# Patient Record
Sex: Female | Born: 1935 | Hispanic: Yes | Marital: Married | Smoking: Never smoker
Health system: Southern US, Community
[De-identification: ages and names within clinical notes are randomized; demographics above are authoritative.]

## PROBLEM LIST (undated history)

## (undated) DIAGNOSIS — R7303 Prediabetes: Secondary | ICD-10-CM

## (undated) DIAGNOSIS — I10 Essential (primary) hypertension: Secondary | ICD-10-CM

## (undated) DIAGNOSIS — C259 Malignant neoplasm of pancreas, unspecified: Secondary | ICD-10-CM

## (undated) DIAGNOSIS — K8020 Calculus of gallbladder without cholecystitis without obstruction: Secondary | ICD-10-CM

## (undated) DIAGNOSIS — M810 Age-related osteoporosis without current pathological fracture: Secondary | ICD-10-CM

## (undated) DIAGNOSIS — E7800 Pure hypercholesterolemia, unspecified: Secondary | ICD-10-CM

## (undated) DIAGNOSIS — C801 Malignant (primary) neoplasm, unspecified: Secondary | ICD-10-CM

---

## 2013-03-12 ENCOUNTER — Inpatient Hospital Stay (HOSPITAL_COMMUNITY)
Admission: EM | Admit: 2013-03-12 | Discharge: 2013-03-18 | DRG: 418 | Disposition: A | Discharge status: Home / Self Care | Payer: 59 | Attending: Internal Medicine | Admitting: Internal Medicine

## 2013-03-12 ENCOUNTER — Emergency Department (HOSPITAL_COMMUNITY)
Admission: EM | Admit: 2013-03-12 | Discharge: 2013-03-12 | Disposition: A | Discharge status: Other Acute Inpatient Hospital | Payer: 59 | Source: Home / Self Care

## 2013-03-12 ENCOUNTER — Emergency Department (HOSPITAL_COMMUNITY): Payer: 59

## 2013-03-12 ENCOUNTER — Encounter (HOSPITAL_COMMUNITY): Payer: Self-pay | Admitting: Emergency Medicine

## 2013-03-12 DIAGNOSIS — E7800 Pure hypercholesterolemia, unspecified: Secondary | ICD-10-CM | POA: Diagnosis present

## 2013-03-12 DIAGNOSIS — K8020 Calculus of gallbladder without cholecystitis without obstruction: Secondary | ICD-10-CM

## 2013-03-12 DIAGNOSIS — B188 Other chronic viral hepatitis: Secondary | ICD-10-CM

## 2013-03-12 DIAGNOSIS — R7989 Other specified abnormal findings of blood chemistry: Secondary | ICD-10-CM | POA: Diagnosis present

## 2013-03-12 DIAGNOSIS — K8001 Calculus of gallbladder with acute cholecystitis with obstruction: Secondary | ICD-10-CM | POA: Diagnosis present

## 2013-03-12 DIAGNOSIS — K5900 Constipation, unspecified: Secondary | ICD-10-CM | POA: Diagnosis present

## 2013-03-12 DIAGNOSIS — I10 Essential (primary) hypertension: Secondary | ICD-10-CM | POA: Diagnosis present

## 2013-03-12 DIAGNOSIS — Q441 Other congenital malformations of gallbladder: Secondary | ICD-10-CM | POA: Diagnosis not present

## 2013-03-12 DIAGNOSIS — R1011 Right upper quadrant pain: Secondary | ICD-10-CM | POA: Diagnosis present

## 2013-03-12 DIAGNOSIS — E871 Hypo-osmolality and hyponatremia: Secondary | ICD-10-CM | POA: Diagnosis present

## 2013-03-12 DIAGNOSIS — R10811 Right upper quadrant abdominal tenderness: Secondary | ICD-10-CM

## 2013-03-12 DIAGNOSIS — Y92009 Unspecified place in unspecified non-institutional (private) residence as the place of occurrence of the external cause: Secondary | ICD-10-CM

## 2013-03-12 DIAGNOSIS — B172 Acute hepatitis E: Secondary | ICD-10-CM

## 2013-03-12 DIAGNOSIS — L299 Pruritus, unspecified: Secondary | ICD-10-CM | POA: Diagnosis present

## 2013-03-12 DIAGNOSIS — K8019 Calculus of gallbladder with other cholecystitis with obstruction: Secondary | ICD-10-CM | POA: Diagnosis present

## 2013-03-12 DIAGNOSIS — R17 Unspecified jaundice: Secondary | ICD-10-CM

## 2013-03-12 DIAGNOSIS — M542 Cervicalgia: Secondary | ICD-10-CM

## 2013-03-12 DIAGNOSIS — M810 Age-related osteoporosis without current pathological fracture: Secondary | ICD-10-CM | POA: Diagnosis present

## 2013-03-12 DIAGNOSIS — Z7982 Long term (current) use of aspirin: Secondary | ICD-10-CM

## 2013-03-12 DIAGNOSIS — K831 Obstruction of bile duct: Secondary | ICD-10-CM

## 2013-03-12 DIAGNOSIS — R5381 Other malaise: Secondary | ICD-10-CM

## 2013-03-12 DIAGNOSIS — IMO0002 Reserved for concepts with insufficient information to code with codable children: Secondary | ICD-10-CM

## 2013-03-12 DIAGNOSIS — Z79899 Other long term (current) drug therapy: Secondary | ICD-10-CM | POA: Diagnosis not present

## 2013-03-12 DIAGNOSIS — W06XXXA Fall from bed, initial encounter: Secondary | ICD-10-CM

## 2013-03-12 DIAGNOSIS — W19XXXA Unspecified fall, initial encounter: Secondary | ICD-10-CM

## 2013-03-12 DIAGNOSIS — Q4479 Other congenital malformations of liver: Secondary | ICD-10-CM

## 2013-03-12 HISTORY — DX: Calculus of gallbladder without cholecystitis without obstruction: K80.20

## 2013-03-12 HISTORY — DX: Age-related osteoporosis without current pathological fracture: M81.0

## 2013-03-12 HISTORY — DX: Essential (primary) hypertension: I10

## 2013-03-12 HISTORY — DX: Pure hypercholesterolemia, unspecified: E78.00

## 2013-03-12 HISTORY — DX: Prediabetes: R73.03

## 2013-03-12 LAB — CBC WITH DIFFERENTIAL/PLATELET
Basophils Absolute: 0 10*3/uL (ref 0.0–0.1)
Hemoglobin: 12.2 g/dL (ref 12.0–15.0)
Lymphocytes Relative: 30 % (ref 12–46)
Lymphs Abs: 1.6 10*3/uL (ref 0.7–4.0)
MCHC: 34.4 g/dL (ref 30.0–36.0)
Monocytes Relative: 12 % (ref 3–12)
Neutro Abs: 2.9 10*3/uL (ref 1.7–7.7)
Neutrophils Relative %: 55 % (ref 43–77)
Platelets: 323 10*3/uL (ref 150–400)
RBC: 4.48 MIL/uL (ref 3.87–5.11)
WBC: 5.2 10*3/uL (ref 4.0–10.5)

## 2013-03-12 LAB — COMPREHENSIVE METABOLIC PANEL
ALT: 508 U/L — ABNORMAL HIGH (ref 0–35)
AST: 449 U/L — ABNORMAL HIGH (ref 0–37)
Alkaline Phosphatase: 723 U/L — ABNORMAL HIGH (ref 39–117)
CO2: 25 mEq/L (ref 19–32)
Chloride: 98 mEq/L (ref 96–112)
GFR calc Af Amer: >90 mL/min (ref >90)
GFR calc non Af Amer: 85 mL/min — ABNORMAL LOW (ref >90)
Glucose, Bld: 113 mg/dL — ABNORMAL HIGH (ref 70–99)
Potassium: 4 mEq/L (ref 3.5–5.1)
Sodium: 134 mEq/L — ABNORMAL LOW (ref 135–145)
Total Bilirubin: 6.4 mg/dL — ABNORMAL HIGH (ref 0.3–1.2)
Total Protein: 7.9 g/dL (ref 6.0–8.3)

## 2013-03-12 LAB — URINALYSIS, ROUTINE W REFLEX MICROSCOPIC
Hgb urine dipstick: NEGATIVE
Ketones, ur: NEGATIVE mg/dL
Specific Gravity, Urine: 1.007 (ref 1.005–1.030)
Urobilinogen, UA: 1 mg/dL (ref 0.0–1.0)
pH: 5.5 (ref 5.0–8.0)

## 2013-03-12 NOTE — ED Provider Notes (Signed)
Medical screening examination/treatment/procedure(s) were performed by non-physician practitioner and as supervising physician I was immediately available for consultation/collaboration.  Leslee Home, M.D.  Reuben Likes, MD 03/12/13 2222

## 2013-03-12 NOTE — ED Notes (Signed)
Pt reports that her stools are lighter in color than normal.

## 2013-03-12 NOTE — ED Notes (Signed)
Pt is reporting cramping in her legs.

## 2013-03-12 NOTE — ED Notes (Signed)
Multiple c/o. Reported fall 2 days ago, struck posterior aspect of neck. C/o generalized weakness. States her eyes have turned yellow, stool has changed color, UA dark, burning sensation in feet and hands

## 2013-03-12 NOTE — ED Provider Notes (Signed)
CSN: [id number]     Arrival date & time 03/12/13  1416 History   First MD Initiated Contact with Patient 03/12/13 1544     Chief Complaint  Patient presents with  . Weakness   (Consider location/radiation/quality/duration/timing/severity/associated sxs/prior Treatment) HPI Comments: 79 year old female with a history of hepatitis E contracted proximally 7 years ago in [place] he is complaining of generalized itching, fatigue and malaise, yellowing of the skin and eyes, burning in the hands and feet and decreased appetite. This started just over 7 days ago. One week ago on Thanksgiving day the patient fell from the bed and struck the back of her head and neck on the windowsill. She is complaining of tenderness over the T1 spinous process. Her head feels heavy at the frontal and vertex aspect on occasion. There was no loss of consciousness and she denies any focal neurologic symptoms. Hx of cholelithiasis with small stones considered too small and non obstructing to consider surgical intervention.    Past Medical History  Diagnosis Date  . Hypertension   . High cholesterol   . Osteoporosis   . Prediabetes    History reviewed. No pertinent past surgical history. History reviewed. No pertinent family history. History  Substance Use Topics  . Smoking status: Never Smoker   . Smokeless tobacco: Not on file  . Alcohol Use: No   OB History   Grav Para Term Preterm Abortions TAB SAB Ect Mult Living                 Review of Systems  Constitutional: Positive for activity change, appetite change and fatigue. Negative for fever.  HENT: Negative.   Eyes:       Yellow eyes  Respiratory: Negative.   Cardiovascular: Negative.   Gastrointestinal: Positive for nausea. Negative for vomiting and abdominal pain.       Change in color of stools  Genitourinary: Negative for dysuria, frequency, flank pain and pelvic pain.       Change of color to urine  Neurological: Positive for light-headedness.  Negative for tremors, syncope, facial asymmetry and speech difficulty.    Allergies  Penicillins  Home Medications   Current Outpatient Rx  Name  Route  Sig  Dispense  Refill  . alendronate (FOSAMAX) 70 MG tablet   Oral   Take 70 mg by mouth once a week. Take with a full glass of water on an empty stomach.         Marland Kitchen aspirin 81 MG tablet   Oral   Take 81 mg by mouth daily.         . moexipril-hydrochlorothiazide (UNIRETIC) 7.5-12.5 MG per tablet   Oral   Take 1 tablet by mouth daily.         . rosuvastatin (CRESTOR) 5 MG tablet   Oral   Take 5 mg by mouth daily.          BP 129/49  Pulse 73  Temp(Src) 98 F (36.7 C) (Oral)  Resp 16  SpO2 100% Physical Exam  Nursing note and vitals reviewed. Constitutional: She is oriented to person, place, and time. She appears well-developed and well-nourished. No distress.  HENT:  Head: Normocephalic.  Mouth/Throat: Oropharynx is clear and moist. No oropharyngeal exudate.  Head nontender,   Eyes: EOM are normal. Scleral icterus is present.  Neck: Normal range of motion. Neck supple.  Tenderness over T1 spinous process only. No other neck pain or tenderness. No deformity.  Cardiovascular: Normal rate, regular rhythm and normal  heart sounds.   Pulmonary/Chest: Effort normal and breath sounds normal. No respiratory distress. She has no wheezes.  Abdominal: Soft. She exhibits no distension. There is tenderness. There is no rebound and no guarding.  Moderate tenderness over the RUQ and epigastrium.  Musculoskeletal: Normal range of motion. She exhibits no edema.  Lymphadenopathy:    She has no cervical adenopathy.  Neurological: She is alert and oriented to person, place, and time. She exhibits normal muscle tone.  Skin: Skin is warm and dry.  Skin jaundice   Psychiatric: She has a normal mood and affect.    ED Course  Procedures (including critical care time) Labs Review Labs Reviewed - No data to display Imaging  Review No results found.      MDM   1. Chronic hepatitis E   2. Jaundice   3. Malaise and fatigue   4. Abdominal tenderness, right upper quadrant   5. Pain in neck   6. Fall at home, initial encounter      Transfer to [facility] ED for hepatic disease associated with acute symptoms. Fall 7 d ago has heaviness in head and pain in neck over T1 spinous process.  Hayden Rasmussen, NP 03/12/13 [phone]

## 2013-03-12 NOTE — ED Notes (Addendum)
Pt sent here from [facility] for further work-up RE RUQ pain, yellow eyes, dark urine and burning sensation of feet and hands.  Hx of gall stones in 2011 and Hep E.  Tenderness on palpation only.

## 2013-03-12 NOTE — ED Notes (Signed)
Per ultrasound tech, transport is en route to take pt to ultrasound.

## 2013-03-12 NOTE — ED Provider Notes (Signed)
Pt presents to the hospital with increased jaundiced and RUQ pain - on exam has mild guarding to the RUQ but no peritoneal signs,  HDS with normal rate, rhythma, lung sounds adn no fever.  Significant transaminitis and bili - RUQ [place] c/w an impacted stone - GI consulted for ERCP and hospitalist for admission.  I saw and evaluated the patient, reviewed the resident's note and I agree with the findings and plan.   Vida Roller, MD 03/12/13 [phone]

## 2013-03-13 DIAGNOSIS — K838 Other specified diseases of biliary tract: Secondary | ICD-10-CM

## 2013-03-13 DIAGNOSIS — K831 Obstruction of bile duct: Secondary | ICD-10-CM | POA: Diagnosis present

## 2013-03-13 DIAGNOSIS — L299 Pruritus, unspecified: Secondary | ICD-10-CM | POA: Diagnosis present

## 2013-03-13 DIAGNOSIS — R7989 Other specified abnormal findings of blood chemistry: Secondary | ICD-10-CM | POA: Diagnosis present

## 2013-03-13 DIAGNOSIS — B172 Acute hepatitis E: Secondary | ICD-10-CM | POA: Diagnosis present

## 2013-03-13 LAB — COMPREHENSIVE METABOLIC PANEL
ALT: 446 U/L — ABNORMAL HIGH (ref 0–35)
AST: 402 U/L — ABNORMAL HIGH (ref 0–37)
Albumin: 3.1 g/dL — ABNORMAL LOW (ref 3.5–5.2)
CO2: 25 mEq/L (ref 19–32)
Calcium: 9 mg/dL (ref 8.4–10.5)
Creatinine, Ser: 0.51 mg/dL (ref 0.50–1.10)
GFR calc non Af Amer: 88 mL/min — ABNORMAL LOW (ref >90)
Potassium: 4.6 mEq/L (ref 3.5–5.1)
Sodium: 137 mEq/L (ref 135–145)
Total Bilirubin: 6.4 mg/dL — ABNORMAL HIGH (ref 0.3–1.2)
Total Protein: 7.2 g/dL (ref 6.0–8.3)

## 2013-03-13 LAB — HEPATIC FUNCTION PANEL
ALT: 456 U/L — ABNORMAL HIGH (ref 0–35)
AST: 397 U/L — ABNORMAL HIGH (ref 0–37)
Albumin: 3.1 g/dL — ABNORMAL LOW (ref 3.5–5.2)
Alkaline Phosphatase: 652 U/L — ABNORMAL HIGH (ref 39–117)
Bilirubin, Direct: 5 mg/dL — ABNORMAL HIGH (ref 0.0–0.3)
Total Bilirubin: 6.7 mg/dL — ABNORMAL HIGH (ref 0.3–1.2)
Total Protein: 7.1 g/dL (ref 6.0–8.3)

## 2013-03-13 LAB — CBC
HCT: 34.6 % — ABNORMAL LOW (ref 36.0–46.0)
Hemoglobin: 11.9 g/dL — ABNORMAL LOW (ref 12.0–15.0)
MCH: 27.3 pg (ref 26.0–34.0)
MCHC: 34.4 g/dL (ref 30.0–36.0)
MCV: 79.4 fL (ref 78.0–100.0)
RBC: 4.36 MIL/uL (ref 3.87–5.11)

## 2013-03-13 LAB — HEPATITIS PANEL, ACUTE
Hep A IgM: NONREACTIVE
Hep B C IgM: NONREACTIVE

## 2013-03-13 LAB — PROTIME-INR: Prothrombin Time: 12.9 seconds (ref 11.6–15.2)

## 2013-03-13 LAB — LIPASE, BLOOD: Lipase: 51 U/L (ref 11–59)

## 2013-03-13 MED ORDER — ONDANSETRON HCL 4 MG PO TABS: 4.0000 mg | ORAL_TABLET | Freq: Four times a day (QID) | ORAL | Status: DC | PRN

## 2013-03-13 MED ORDER — ENOXAPARIN SODIUM 30 MG/0.3ML ~~LOC~~ SOLN
30.0000 mg | SUBCUTANEOUS | Status: DC
  Administered 2013-03-13: 30 mg via SUBCUTANEOUS
  Filled 2013-03-13: qty 0.3

## 2013-03-13 MED ORDER — CIPROFLOXACIN IN D5W 400 MG/200ML IV SOLN
400.0000 mg | Freq: Two times a day (BID) | INTRAVENOUS | Status: DC
  Administered 2013-03-13 – 2013-03-17 (×10): 400 mg via INTRAVENOUS
  Filled 2013-03-13 (×11): qty 200

## 2013-03-13 MED ORDER — CIPROFLOXACIN IN D5W 400 MG/200ML IV SOLN
400.0000 mg | Freq: Once | INTRAVENOUS | Status: AC
  Administered 2013-03-13: 400 mg via INTRAVENOUS
  Filled 2013-03-13: qty 200

## 2013-03-13 MED ORDER — OXYCODONE HCL 5 MG PO TABS
5.0000 mg | ORAL_TABLET | ORAL | Status: DC | PRN
  Filled 2013-03-13: qty 1

## 2013-03-13 MED ORDER — PANTOPRAZOLE SODIUM 40 MG IV SOLR
40.0000 mg | INTRAVENOUS | Status: DC
  Administered 2013-03-13 – 2013-03-18 (×5): 40 mg via INTRAVENOUS
  Filled 2013-03-13 (×6): qty 40

## 2013-03-13 MED ORDER — ONDANSETRON HCL 4 MG/2ML IJ SOLN
4.0000 mg | Freq: Four times a day (QID) | INTRAMUSCULAR | Status: DC | PRN
  Administered 2013-03-14 – 2013-03-17 (×4): 4 mg via INTRAVENOUS
  Filled 2013-03-13 (×5): qty 2

## 2013-03-13 MED ORDER — SODIUM CHLORIDE 0.9 % IV SOLN
INTRAVENOUS | Status: DC
  Administered 2013-03-13 – 2013-03-15 (×3): via INTRAVENOUS
  Administered 2013-03-17: 20 mL/h via INTRAVENOUS

## 2013-03-13 NOTE — ED Notes (Signed)
Per Post, MD pt given water.

## 2013-03-13 NOTE — Progress Notes (Signed)
[facility]                                                                                Patient Demographics  Vicki Chavez, is a 79 y.o. female, DOB - Sep 19, 1929, ZOX:[id number]  Admit date - 03/12/2013   Admitting Physician Lynden Oxford, MD  Outpatient Primary MD for the patient is Pcp Not In System  LOS - 1   Chief Complaint  Patient presents with  . Abdominal Pain        Assessment & Plan   Principal Problem:   Obstructive jaundice Active Problems:   Biliary obstruction   h/o Hepatitis E   Elevated LFTs   Itching  Acute abdominal pain secondary to possible choledocholithiasis -Patient has obstructive jaundice -Gastroenterology consulted -Pending medical records from [facility] -Will continue to monitor her LFTs -Possible ERCP -Continue ciprofloxacin and Protonix  History of hepatitis E -Currently stable we'll obtain medical records  Elevated LFTs and biliary obstruction -Likely secondary to above we'll continue to monitor  Hyponatremia -Na 137, improving.   -Continue IVF  Code Status: Full  Family Communication: None at this time.  Disposition Plan: Admitted.  Expected LOS 2-3 midnights.   Procedures  None  Consults   Gastroenterology, Dr. Juanda Chance  DVT Prophylaxis Heparin  Lab Results  Component Value Date   PLT 292 03/13/2013    Medications  Scheduled Meds: . ciprofloxacin  400 mg Intravenous Q12H  . enoxaparin (LOVENOX) injection  30 mg Subcutaneous Q24H  . pantoprazole (PROTONIX) IV  40 mg Intravenous Q24H   Continuous Infusions: . sodium chloride 50 mL/hr at 03/13/13 0656   PRN Meds:.ondansetron (ZOFRAN) IV, ondansetron, oxyCODONE  Antibiotics    Anti-infectives   Start     Dose/Rate Route Frequency Ordered Stop   03/13/13 1200  ciprofloxacin (CIPRO) IVPB 400 mg     400 mg 200 mL/hr over 60 Minutes Intravenous Every 12 hours 03/13/13 0646     03/13/13 0015  ciprofloxacin (CIPRO) IVPB 400 mg     400 mg 200  mL/hr over 60 Minutes Intravenous  Once 03/13/13 0000 03/13/13 0143       Time Spent in minutes   30 minutes   Ashle Stief D.O. on 03/13/2013 at 11:53 AM  Between 7am to 7pm - Pager - [phone]  After 7pm go to [email or web address] - password [email or web address]  And look for the night coverage person covering for me after hours  [facility] Office  [phone]    Subjective:   Vicki Chavez seen and examined today.  Patient states her abdominal pain has improved. Patient denies dizziness, chest pain, shortness of breath, new weakness, numbess, tingling.    Objective:   Filed Vitals:   03/12/13 2121 03/13/13 0010 03/13/13 0108 03/13/13 0549  BP: 152/64 176/76 153/70 113/55  Pulse: 70 76 75 80  Temp:   97.5 F (36.4 C) 98.3 F (36.8 C)  TempSrc:   Oral Oral  Resp:   16 16  Height:      Weight:      SpO2: 100% 99% 100% 100%    Wt Readings from Last 3 Encounters:  03/12/13 50.984 kg (112 lb 6.4 oz)  Intake/Output Summary (Last 24 hours) at 03/13/13 1153 Last data filed at 03/13/13 0418  Gross per 24 hour  Intake      0 ml  Output    450 ml  Net   -450 ml    Exam  General: Well developed, well nourished, NAD, appears stated age  HEENT: NCAT, PERRLA, EOMI,  icteric sclera,  mucous membranes moist. No pharyngeal erythema or exudates  Neck: Supple, no JVD, no masses  Cardiovascular: S1 S2 auscultated, no rubs, murmurs or gallops. Regular rate and rhythm.  Respiratory: Clear to auscultation bilaterally with equal chest rise  Abdomen: Soft, mild tenderness to palpation of the right upper quadrant, nondistended, + bowel sounds  Extremities: warm dry without cyanosis clubbing or edema  Neuro: AAOx3, cranial nerves grossly intact. Strength 5/5 in patient's upper and lower extremities bilaterally  Skin: Without rashes exudates or nodules  Psych: Normal affect and demeanor with intact judgement and insight  Data Review   Micro Results No results found  for this or any previous visit (from the past 240 hour(s)).  Radiology Reports US Abdomen Complete  03/12/2013   CLINICAL DATA:  Abdominal pain.  EXAM: ULTRASOUND ABDOMEN COMPLETE  COMPARISON:  None.  FINDINGS: Gallbladder:  Multiple echogenic structures within the gallbladder are compatible with stones. Mild distention of the gallbladder. No significant gallbladder wall thickening. Reportedly, the patient does not have a sonographic Murphy's sign. Largest gallstone measures 1.2 cm. There are echogenic structures along the gallbladder wall that are suggestive for polyps and the largest measures 0.7 cm.  Common bile duct:  Diameter: Common bile duct is markedly dilated, measuring up to 1.5 cm.  Liver:  No focal lesion identified. Within normal limits in parenchymal echogenicity.  IVC:  No abnormality visualized.  Pancreas:  Visualized portion unremarkable.  Spleen:  Poorly visualized.  Right Kidney:  Length: 9.7 cm. Round hypoechoic structure in the right kidney lower pole measures 2.7 x 1.8 x 1.9 cm. The may be a septation within this structure and findings are suggestive for a cyst.  Left Kidney:  Length: 10.0 cm. There is mild fullness in left renal collecting system.  Abdominal aorta:  No aneurysm visualized.  Other findings:  None.  IMPRESSION: The gallbladder contains multiple echogenic stones. In addition, there appears to be gallbladder polyps.  Marked dilatation of the common bile duct, measuring up to 1.5 cm. Findings raise concern for a distal common bile duct obstruction.  Mild fullness or hydronephrosis in the left kidney of unknown etiology.  The common bile duct and left kidney findings could be further evaluated with CT.  Right renal cyst.  This cyst may be mildly complex as described.   Electronically Signed   By: Richarda Overlie M.D.   On: 03/12/2013 23:35    CBC  Recent Labs Lab 03/12/13 1646 03/13/13 0750  WBC 5.2 5.8  HGB 12.2 11.9*  HCT 35.5* 34.6*  PLT 323 292  MCV 79.2 79.4  MCH  27.2 27.3  MCHC 34.4 34.4  RDW 16.3* 16.2*  LYMPHSABS 1.6  --   MONOABS 0.7  --   EOSABS 0.1  --   BASOSABS 0.0  --     Chemistries   Recent Labs Lab 03/12/13 1646 03/13/13 0750 03/13/13 0830  NA 134* 137  --   K 4.0 4.6  --   CL 98 101  --   CO2 25 25  --   GLUCOSE 113* 99  --   BUN 20 14  --  CREATININE 0.56 0.51  --   CALCIUM 9.4 9.0  --   AST 449* 402* 397*  ALT 508* 446* 456*  ALKPHOS 723* 631* 652*  BILITOT 6.4* 6.4* 6.7*   ------------------------------------------------------------------------------------------------------------------ estimated creatinine clearance is 39.1 ml/min (by C-G formula based on Cr of 0.51). ------------------------------------------------------------------------------------------------------------------ No results found for this basename: HGBA1C,  in the last 72 hours ------------------------------------------------------------------------------------------------------------------ No results found for this basename: CHOL, HDL, LDLCALC, TRIG, CHOLHDL, LDLDIRECT,  in the last 72 hours ------------------------------------------------------------------------------------------------------------------ No results found for this basename: TSH, T4TOTAL, FREET3, T3FREE, THYROIDAB,  in the last 72 hours ------------------------------------------------------------------------------------------------------------------ No results found for this basename: VITAMINB12, FOLATE, FERRITIN, TIBC, IRON, RETICCTPCT,  in the last 72 hours  Coagulation profile  Recent Labs Lab 03/13/13 0750  INR 0.99    No results found for this basename: DDIMER,  in the last 72 hours  Cardiac Enzymes No results found for this basename: CK, CKMB, TROPONINI, MYOGLOBIN,  in the last 168 hours ------------------------------------------------------------------------------------------------------------------ No components found with this basename: POCBNP,

## 2013-03-13 NOTE — Consult Note (Signed)
   Consultation   History of Present Illness:  This is a 79 yo [place] female with  1 week of sickness, pruritus, dark urine and recent weight loss ( which she attributes to her husband sickness).Presented to ED last night with RUQ abd. Pain. She has a hx of Hepatitis E diagnosed at [facility] in [place] in 6 2006- she underwent liver biopsy. She says that the hepatitis is gone, no further problems. Her daughter is bringing the records ( Mrs Thedore Mins [phone]). This morning pt is feeling fine, denies abd. pain, fever, diarrhea. Her  PCP is in [facility] where she has lived for 10 years- Dr Wilhelmenia [phone].She was told to have gall stones in 2006, but she has never had an attack.. She was, however, jaundiced when she had Hep E in 2006   Past Medical History  Diagnosis Date  . Hypertension   . High cholesterol   . Osteoporosis   . Prediabetes   . Gall stones    History reviewed. No pertinent past surgical history.  reports that she has never smoked. She does not have any smokeless tobacco history on file. She reports that she does not drink alcohol or use illicit drugs. family history is not on file. Allergies  Allergen Reactions  . Penicillins     Does not remember        Review of Systems:dry mouth, pruritus, weight loss  The remainder of the 10 point ROS is negative except as outlined in H&P   Physical Exam: General appearance  slim, in no distress. Eyes- scleras icteric. HEENT nontraumatic, normocephalic. Mouth no lesions, tongue papillated, no cheilosis. Neck supple without adenopathy, thyroid not enlarged, no carotid bruits, no JVD. Lungs Clear to auscultation bilaterally. Cor normal S1, normal S2, regular rapid  rhythm, no murmur,  quiet precordium. Abdomen: soft, relaxed, soft bowl sounds, mild tenderness in RUQ, liver egde at CM, no ascites, splenic tip not pappable Rectal: brown heme negative stool Extremities no pedal edema. Skin no  lesions. Neurological alert and oriented x 3., no asterixis Psychological normal mood and affect.  Assessment and Plan:  79 yo [place] female with acute abd.pain consistent with biliary colic, pain resolved this am- ?? Stone has passed?, She has obstructive jaundice in the setting of dilated CBD which raises a question of chronic choledocholithiasis. There is no discrete stone noted on last night's ultrasound. She is not toxic appearing- no evidence of cholangitis.   Question of chronic liver disease needs to be clarified as to activity and severity- her daughter is bringing records from [facility], depending on the review of the records we will decide about timing of the ERCP. We are awaiting Ptime, and repeat LFT's from this am. Please continue antibiotics. And NPO status, for possible ERCP today  Or tomorrow depending how quickly we can gather the information. I think a malignant biliary stricture is a consideration but rather unlikely given the acute pain episode.   03/13/2013 Lina Sar

## 2013-03-13 NOTE — ED Provider Notes (Signed)
I saw and evaluated the patient, reviewed the resident's note and I agree with the findings and plan.  Please see my separate note regarding my evaluation of the patient.     Vida Roller, MD 03/13/13 [phone]

## 2013-03-13 NOTE — ED Provider Notes (Signed)
CSN: [id number]     Arrival date & time 03/12/13  1615 History   First MD Initiated Contact with Patient 03/12/13 2021     Chief Complaint  Patient presents with  . Abdominal Pain   (Consider location/radiation/quality/duration/timing/severity/associated sxs/prior Treatment) HPI  Chief complaint: Abdominal pain  Patient is 79 year old female past medical history significant for hepatitis C approximately 10 years ago comes in today with chief complaint of generalized fatigue, malaise and right upper quadrant pain. Patient states approximately one week she's been feeling fatigued more than usual. Also describing a vague discomfort in her abdomen. Is been present for one week has been worsening slowly. Has been constant worse with eating. Location of pain is right upper quadrant without radiation. No treatments tried so far. Patient denies any nausea, vomiting or fevers. Patient went to urgent care discovered she had significant right upper quadrant pain also noted to have scleral icterus dark urine. Given history of hepatitis C and gallstones patient was transferred to ED for further evaluation.  Past Medical History  Diagnosis Date  . Hypertension   . High cholesterol   . Osteoporosis   . Prediabetes   . Gall stones    History reviewed. No pertinent past surgical history. No family history on file. History  Substance Use Topics  . Smoking status: Never Smoker   . Smokeless tobacco: Not on file  . Alcohol Use: No   OB History   Grav Para Term Preterm Abortions TAB SAB Ect Mult Living                 Review of Systems  Constitutional: Positive for fatigue. Negative for fever.  Respiratory: Negative for shortness of breath.   Cardiovascular: Negative for chest pain.  Gastrointestinal: Positive for abdominal pain. Negative for nausea and vomiting.  Genitourinary: Negative for dysuria.  Musculoskeletal: Negative for back pain.  Skin: Negative for rash.  Neurological: Negative for  headaches.  Psychiatric/Behavioral: Negative for agitation.  All other systems reviewed and are negative.    Allergies  Penicillins  Home Medications   Current Outpatient Rx  Name  Route  Sig  Dispense  Refill  . alendronate (FOSAMAX) 70 MG tablet   Oral   Take 70 mg by mouth once a week. wednesdays         . aspirin 81 MG tablet   Oral   Take 81 mg by mouth daily.         Marland Kitchen b complex vitamins tablet   Oral   Take 1 tablet by mouth daily.         . calcium-vitamin D (OSCAL WITH D) 500-200 MG-UNIT per tablet   Oral   Take 1 tablet by mouth 2 (two) times daily.         . Ferrous Sulfate (IRON) 325 (65 FE) MG TABS   Oral   Take 1 tablet by mouth once a week. fridays         . moexipril-hydrochlorothiazide (UNIRETIC) 7.5-12.5 MG per tablet   Oral   Take 1 tablet by mouth daily.         . rosuvastatin (CRESTOR) 5 MG tablet   Oral   Take 5 mg by mouth at bedtime.           BP 176/76  Pulse 76  Temp(Src) 97.2 F (36.2 C) (Oral)  Resp 16  Ht 4\' 10"  (1.473 m)  Wt 112 lb 6.4 oz (50.984 kg)  BMI 23.50 kg/m2  SpO2 99% Physical Exam  Nursing note and vitals reviewed. Constitutional: She is oriented to person, place, and time. She appears well-developed and well-nourished.  HENT:  Head: Normocephalic and atraumatic.  Eyes: EOM are normal. Pupils are equal, round, and reactive to light. Scleral icterus is present.  Neck: Normal range of motion.  Cardiovascular: Normal rate, regular rhythm and intact distal pulses.   No murmur heard. Pulmonary/Chest: Effort normal and breath sounds normal. No respiratory distress.  Abdominal: Soft. She exhibits no distension. There is tenderness (right upper quadrant pain). There is no rebound and no guarding.  Positive Murphy sign  Musculoskeletal: Normal range of motion. She exhibits no edema.  Neurological: She is alert and oriented to person, place, and time. No cranial nerve deficit. She exhibits normal muscle tone.  Coordination normal.  Skin: Skin is warm and dry.  Psychiatric: She has a normal mood and affect. Her behavior is normal. Judgment and thought content normal.    ED Course  Procedures (including critical care time) Labs Review Labs Reviewed  CBC WITH DIFFERENTIAL - Abnormal; Notable for the following:    HCT 35.5 (*)    RDW 16.3 (*)    All other components within normal limits  COMPREHENSIVE METABOLIC PANEL - Abnormal; Notable for the following:    Sodium 134 (*)    Glucose, Bld 113 (*)    Albumin 3.4 (*)    AST 449 (*)    ALT 508 (*)    Alkaline Phosphatase 723 (*)    Total Bilirubin 6.4 (*)    GFR calc non Af Amer 85 (*)    All other components within normal limits  LIPASE, BLOOD - Abnormal; Notable for the following:    Lipase 86 (*)    All other components within normal limits  URINALYSIS, ROUTINE W REFLEX MICROSCOPIC - Abnormal; Notable for the following:    Bilirubin Urine SMALL (*)    All other components within normal limits   Imaging Review US Abdomen Complete  03/12/2013   CLINICAL DATA:  Abdominal pain.  EXAM: ULTRASOUND ABDOMEN COMPLETE  COMPARISON:  None.  FINDINGS: Gallbladder:  Multiple echogenic structures within the gallbladder are compatible with stones. Mild distention of the gallbladder. No significant gallbladder wall thickening. Reportedly, the patient does not have a sonographic Murphy's sign. Largest gallstone measures 1.2 cm. There are echogenic structures along the gallbladder wall that are suggestive for polyps and the largest measures 0.7 cm.  Common bile duct:  Diameter: Common bile duct is markedly dilated, measuring up to 1.5 cm.  Liver:  No focal lesion identified. Within normal limits in parenchymal echogenicity.  IVC:  No abnormality visualized.  Pancreas:  Visualized portion unremarkable.  Spleen:  Poorly visualized.  Right Kidney:  Length: 9.7 cm. Round hypoechoic structure in the right kidney lower pole measures 2.7 x 1.8 x 1.9 cm. The may be a  septation within this structure and findings are suggestive for a cyst.  Left Kidney:  Length: 10.0 cm. There is mild fullness in left renal collecting system.  Abdominal aorta:  No aneurysm visualized.  Other findings:  None.  IMPRESSION: The gallbladder contains multiple echogenic stones. In addition, there appears to be gallbladder polyps.  Marked dilatation of the common bile duct, measuring up to 1.5 cm. Findings raise concern for a distal common bile duct obstruction.  Mild fullness or hydronephrosis in the left kidney of unknown etiology.  The common bile duct and left kidney findings could be further evaluated with CT.  Right renal cyst.  This cyst  may be mildly complex as described.   Electronically Signed   By: Richarda Overlie M.D.   On: 03/12/2013 23:35    EKG Interpretation   None       MDM   1. Biliary obstruction     On arrival afebrile vital signs within normal limits. Patient with RUQ pain, scleral icterus, complains of dark urine, history of hepatitis E. and gallstones. Concern for gallbladder pathology including cholecystitis, choledocholithiasis or acute hepatitis. Right upper quadrant ultrasound and labs obtained. Ultrasound showed dilation of common bile duct with likely distal common bile duct obstruction labs significant for transaminitis, elevated lipase, elevated bilirubin. CBC showed no leukocytosis. Ultrasound showed no pericholecystic stranding or other findings consistent with cholecystitis. No need for emergent removal of gallbladder. Secondary to obstruction biliary tract GI was consult and will plan for MRCP and morning. Admitted to medicine. Patient was given one dose of Cipro in ED her GI recommendation. No other acute issues. Patient remained stable in ED until time of transfer to floor.   Bridgett Larsson, MD 03/13/13 [phone]

## 2013-03-13 NOTE — H&P (Signed)
[facility] History and Physical  Patient: Vicki Chavez  OZH:[id number]  DOB: 02-Jul-1929  DOS: the patient was seen and examined on 03/13/2013 PCP: Pcp Not In System  Chief Complaint: Abdominal pain  HPI: Vicki Chavez is a 79 y.o. female with Past medical history of hypertension, prediabetes, goes to, hepatitis E. The patient is coming from home. The patient presented with the complaints of right upper quadrant pain that has been ongoing since last one week. This was associated with fatigue and increased itchiness. She started noticing yellow discoloration of her urine as well and therefore she came to the hospital today. She denies any complaint of fever or chills. She mentions that she has some discomfort in her abdomen which gets worse with feeding. She denies any diarrhea or black color bowel movements or constipation. She mentions she has history of hepatitis B which he encountered in [place] and she also had a history of GI bleeding for which she went to [place] to fix it. At present she denies any complaint of chest pain, shortness of breath, palpitation, dizziness, lightheadedness, nausea, vomiting, burning urination, but diarrhea or constipation. She denies any similar symptoms in the past. She mentions 3 years ago she has been diagnosed with gallstone but did not have any symptoms therefore did not have any further workup.  Review of Systems: as mentioned in the history of present illness.  A Comprehensive review of the other systems is negative.  Past Medical History  Diagnosis Date  . Hypertension   . High cholesterol   . Osteoporosis   . Prediabetes   . Gall stones    History reviewed. No pertinent past surgical history. Social History:  reports that she has never smoked. She does not have any smokeless tobacco history on file. She reports that she does not drink alcohol or use illicit drugs. Independent for most of her  ADL.  Allergies  Allergen Reactions  .  Penicillins     Does not remember    No family history on file.  Prior to Admission medications   Medication Sig Start Date End Date Taking? Authorizing Provider  alendronate (FOSAMAX) 70 MG tablet Take 70 mg by mouth once a week. wednesdays   Yes Historical Provider, MD  aspirin 81 MG tablet Take 81 mg by mouth daily.   Yes Historical Provider, MD  b complex vitamins tablet Take 1 tablet by mouth daily.   Yes Historical Provider, MD  calcium-vitamin D (OSCAL WITH D) 500-200 MG-UNIT per tablet Take 1 tablet by mouth 2 (two) times daily.   Yes Historical Provider, MD  Ferrous Sulfate (IRON) 325 (65 FE) MG TABS Take 1 tablet by mouth once a week. fridays   Yes Historical Provider, MD  moexipril-hydrochlorothiazide (UNIRETIC) 7.5-12.5 MG per tablet Take 1 tablet by mouth daily.   Yes Historical Provider, MD  rosuvastatin (CRESTOR) 5 MG tablet Take 5 mg by mouth at bedtime.    Yes Historical Provider, MD    Physical Exam: Filed Vitals:   03/12/13 1903 03/12/13 2121 03/13/13 0010 03/13/13 0108  BP: 146/60 152/64 176/76 153/70  Pulse: 82 70 76 75  Temp: 97.2 F (36.2 C)   97.5 F (36.4 C)  TempSrc: Oral   Oral  Resp: 16   16  Height:      Weight:      SpO2: 100% 100% 99% 100%    General: Alert, Awake and Oriented to Time, Place and Person. Appear in mild distress Eyes: PERRL yellow discoloration ENT: Oral  Mucosa clear dry. Yellow discoloration Neck: No JVD Cardiovascular: S1 and S2 Present, no Murmur, Peripheral Pulses Present Respiratory: Bilateral Air entry equal and Decreased, Clear to Auscultation,  No Crackles,  Abdomen: Bowel Sound Present, Soft and negative Murphy, minimally tender right upper quadrant without any guarding or rigidity Skin: No Rash but yellow discoloration of the skin Extremities: No Pedal edema, no calf tenderness Neurologic: Grossly Unremarkable.  Labs on Admission:  CBC:  Recent Labs Lab 03/12/13 1646  WBC 5.2  NEUTROABS 2.9  HGB 12.2  HCT  35.5*  MCV 79.2  PLT 323    CMP     Component Value Date/Time   NA 134* 03/12/2013 1646   K 4.0 03/12/2013 1646   CL 98 03/12/2013 1646   CO2 25 03/12/2013 1646   GLUCOSE 113* 03/12/2013 1646   BUN 20 03/12/2013 1646   CREATININE 0.56 03/12/2013 1646   CALCIUM 9.4 03/12/2013 1646   PROT 7.9 03/12/2013 1646   ALBUMIN 3.4* 03/12/2013 1646   AST 449* 03/12/2013 1646   ALT 508* 03/12/2013 1646   ALKPHOS 723* 03/12/2013 1646   BILITOT 6.4* 03/12/2013 1646   GFRNONAA 85* 03/12/2013 1646   GFRAA >90 03/12/2013 1646     Recent Labs Lab 03/12/13 1646  LIPASE 86*   No results found for this basename: AMMONIA,  in the last 168 hours  No results found for this basename: CKTOTAL, CKMB, CKMBINDEX, TROPONINI,  in the last 168 hours BNP (last 3 results) No results found for this basename: PROBNP,  in the last 8760 hours  Radiological Exams on Admission: US Abdomen Complete  03/12/2013   CLINICAL DATA:  Abdominal pain.  EXAM: ULTRASOUND ABDOMEN COMPLETE  COMPARISON:  None.  FINDINGS: Gallbladder:  Multiple echogenic structures within the gallbladder are compatible with stones. Mild distention of the gallbladder. No significant gallbladder wall thickening. Reportedly, the patient does not have a sonographic Murphy's sign. Largest gallstone measures 1.2 cm. There are echogenic structures along the gallbladder wall that are suggestive for polyps and the largest measures 0.7 cm.  Common bile duct:  Diameter: Common bile duct is markedly dilated, measuring up to 1.5 cm.  Liver:  No focal lesion identified. Within normal limits in parenchymal echogenicity.  IVC:  No abnormality visualized.  Pancreas:  Visualized portion unremarkable.  Spleen:  Poorly visualized.  Right Kidney:  Length: 9.7 cm. Round hypoechoic structure in the right kidney lower pole measures 2.7 x 1.8 x 1.9 cm. The may be a septation within this structure and findings are suggestive for a cyst.  Left Kidney:  Length: 10.0 cm. There is mild  fullness in left renal collecting system.  Abdominal aorta:  No aneurysm visualized.  Other findings:  None.  IMPRESSION: The gallbladder contains multiple echogenic stones. In addition, there appears to be gallbladder polyps.  Marked dilatation of the common bile duct, measuring up to 1.5 cm. Findings raise concern for a distal common bile duct obstruction.  Mild fullness or hydronephrosis in the left kidney of unknown etiology.  The common bile duct and left kidney findings could be further evaluated with CT.  Right renal cyst.  This cyst may be mildly complex as described.   Electronically Signed   By: Richarda Overlie M.D.   On: 03/12/2013 23:35    EKG: Independently reviewed. normal EKG, normal sinus rhythm, unchanged from previous tracings.  Assessment/Plan Principal Problem:   Obstructive jaundice Active Problems:   Biliary obstruction   h/o Hepatitis E   Elevated LFTs  Itching   1. Obstructive jaundice The patient is presenting with complaints of right upper quadrant pain with yellow discoloration of the skin. She has undergone an ultrasound in the ER which shows that she has significantly dilated common bile duct as well as multiple gallstones. He has discussed the case with GI. I was initially told her that GI has decided to pursue ERCP but then the ED documentation does mention MRCP. At present I would keep the patient n.p.o. an attempt to discuss the case with GI. I will give the patient IV fluids I would continue her on IV Cipro I would also give her IV Zofran as needed and IV Protonix. I would hold her antihypertensive at present he  2. Itching Likely secondary to elevated bilirubin Should improve with improvement in LFTs   Consults: Gastroenterology  DVT Prophylaxis: subcutaneous Heparin  Code Status: Full  Family Communication: Family was present at bedside, opportunity was given to ask question and all questions were answered satisfactorily at the time of  interview. Disposition: Admitted to inpatient in med-surge unit.  Author: Lynden Oxford, MD [facility] Pager: [phone] 03/13/2013, 2:12 AM    If 7PM-7AM, please contact night-coverage [email or web address] Password [email or web address]

## 2013-03-13 NOTE — Progress Notes (Addendum)
I have reviewed records from 09/2004- acute Hep E- liver biopsy shows acute periportal inflammation, no granulomas, negative trichrome stain, c/w acute hepatitis. She had a abd. sono in 2011: 4.5 mm CBD , normal liver and spleen( 7 cm), gall stones ( wall 2 mm), She had normal LFT's in 11/2012 AST/ALT 22/23.  Imresssion: no significant chronic liver disease present, the current episode is c/w acute biliary colic. We will proceed with ERCP/sphicterotomy, clearance of the CBD by Dr Madilyn Fireman, I have spoken to Dr Georgeann Oppenheim, scheduled for 03/14/2013 11.00 am, with moderate sedation. I have discussed with the family. Will obtain surgical consult- pt wishes to proceed with lap chole while in the hospital. Continue antibiotics. INR normal  Hold Lovenox prior to ERCP

## 2013-03-13 NOTE — Progress Notes (Signed)
79yo female c/o RUQ pain x1wk, noted w/ yellow discoloration of skin, US reveals significantly dilated common bile duct as well as multiple gallstones, to begin IV ABX for cholecystitis.  Will begin Cipro 400 mg IV Q12H and monitor CBC, Cx, clinical progression.  Vernard Gambles, PharmD, BCPS 03/13/2013 6:49 AM

## 2013-03-14 ENCOUNTER — Inpatient Hospital Stay (HOSPITAL_COMMUNITY): Payer: 59

## 2013-03-14 ENCOUNTER — Encounter (HOSPITAL_COMMUNITY): Payer: Self-pay

## 2013-03-14 ENCOUNTER — Encounter (HOSPITAL_COMMUNITY)
Admission: EM | Disposition: A | Discharge status: Home / Self Care | Payer: Self-pay | Source: Home / Self Care | Attending: Internal Medicine

## 2013-03-14 DIAGNOSIS — K8001 Calculus of gallbladder with acute cholecystitis with obstruction: Secondary | ICD-10-CM | POA: Diagnosis not present

## 2013-03-14 DIAGNOSIS — R1011 Right upper quadrant pain: Secondary | ICD-10-CM | POA: Diagnosis not present

## 2013-03-14 DIAGNOSIS — K8020 Calculus of gallbladder without cholecystitis without obstruction: Secondary | ICD-10-CM

## 2013-03-14 HISTORY — PX: ERCP: SHX5425

## 2013-03-14 LAB — COMPREHENSIVE METABOLIC PANEL
ALT: 430 U/L — ABNORMAL HIGH (ref 0–35)
AST: 364 U/L — ABNORMAL HIGH (ref 0–37)
Albumin: 2.9 g/dL — ABNORMAL LOW (ref 3.5–5.2)
CO2: 23 mEq/L (ref 19–32)
Calcium: 8.9 mg/dL (ref 8.4–10.5)
Chloride: 102 mEq/L (ref 96–112)
Creatinine, Ser: 0.55 mg/dL (ref 0.50–1.10)
GFR calc non Af Amer: 86 mL/min — ABNORMAL LOW (ref >90)
Sodium: 137 mEq/L (ref 135–145)
Total Bilirubin: 5.7 mg/dL — ABNORMAL HIGH (ref 0.3–1.2)

## 2013-03-14 LAB — GLUCOSE, CAPILLARY: Glucose-Capillary: 111 mg/dL — ABNORMAL HIGH (ref 70–99)

## 2013-03-14 SURGERY — ERCP, WITH INTERVENTION IF INDICATED
Anesthesia: Moderate Sedation

## 2013-03-14 MED ORDER — MIDAZOLAM HCL 5 MG/ML IJ SOLN
INTRAMUSCULAR | Status: AC
  Filled 2013-03-14: qty 3

## 2013-03-14 MED ORDER — SODIUM CHLORIDE 0.9 % IV SOLN: INTRAVENOUS | Status: DC

## 2013-03-14 MED ORDER — HYDRALAZINE HCL 20 MG/ML IJ SOLN
10.0000 mg | Freq: Four times a day (QID) | INTRAMUSCULAR | Status: DC | PRN
  Administered 2013-03-14: 10 mg via INTRAVENOUS
  Filled 2013-03-14: qty 1

## 2013-03-14 MED ORDER — FENTANYL CITRATE 0.05 MG/ML IJ SOLN
INTRAMUSCULAR | Status: AC
  Filled 2013-03-14: qty 4

## 2013-03-14 MED ORDER — MIDAZOLAM HCL 10 MG/2ML IJ SOLN
INTRAMUSCULAR | Status: DC | PRN
  Administered 2013-03-14 (×2): 2 mg via INTRAVENOUS
  Administered 2013-03-14: 1 mg via INTRAVENOUS

## 2013-03-14 MED ORDER — BUTAMBEN-TETRACAINE-BENZOCAINE 2-2-14 % EX AERO
INHALATION_SPRAY | CUTANEOUS | Status: DC | PRN
  Administered 2013-03-14: 2 via TOPICAL

## 2013-03-14 MED ORDER — CHLORHEXIDINE GLUCONATE 4 % EX LIQD
1.0000 "application " | Freq: Once | CUTANEOUS | Status: AC
  Administered 2013-03-14: 1 via TOPICAL
  Filled 2013-03-14: qty 15

## 2013-03-14 MED ORDER — GLUCAGON HCL (RDNA) 1 MG IJ SOLR
INTRAMUSCULAR | Status: AC
  Filled 2013-03-14: qty 3

## 2013-03-14 MED ORDER — CIPROFLOXACIN IN D5W 400 MG/200ML IV SOLN
400.0000 mg | INTRAVENOUS | Status: DC
  Filled 2013-03-14: qty 200

## 2013-03-14 MED ORDER — SODIUM CHLORIDE 0.9 % IV SOLN
INTRAVENOUS | Status: DC | PRN
  Administered 2013-03-14: 12:00:00

## 2013-03-14 MED ORDER — FENTANYL CITRATE 0.05 MG/ML IJ SOLN
INTRAMUSCULAR | Status: DC | PRN
  Administered 2013-03-14 (×3): 25 ug via INTRAVENOUS

## 2013-03-14 NOTE — Progress Notes (Signed)
ERCP done. There appeared to be a large distal stone with diffuse biliary tree dilatation. A large sphincterotomy was performed and balloon drag through the sphincterotomy. We did not see a stone delivered but on multiple subsequent cholangiograms there appeared to be no further stones. Some of the later cholangiograms were opacified with air bubbles but was felt to there is no residual stone. Okay for laparoscopic cholecystectomy from GI standpoint. Will check liver function tests tomorrow.

## 2013-03-14 NOTE — Op Note (Signed)
[facility] [address] [place], [address]   ERCP PROCEDURE REPORT  PATIENT: Vicki, Chavez  MR# :[id number] BIRTHDATE: 1929-12-18  GENDER: Female ENDOSCOPIST: Dorena Cookey, MD REFERRED BY: PROCEDURE DATE:  03/14/2013 PROCEDURE: ASA CLASS: INDICATIONS:gallstones with dilated bile duct suspected common bile duct stone MEDICATIONS: 75 mcg fentanyl 5 mg Versed TOPICAL ANESTHETIC: Cetacaine spray  DESCRIPTION OF PROCEDURE:   After the risks benefits and alternatives of the procedure were thoroughly explained, informed consent was obtained.  The Pentax side-viewing       endoscope was introduced through the mouth  and advanced to the papilla of Vater      .  it had a normal if somewhat enlarged appearance.      it was cannulated with the sphincterotome and a very large sphincterotomy made after cholangiogram showed a large distal common bile duct stone. A 15-18 mm balloon catheter was advanced into the duct and pulled through the sphincterotomy nearly fully inflated. We did not see a stone endoscopically but on repeat injury and opacification of the bile duct we can no longer see the stone. There is diffuse intra-and extrahepatic dilatation. The balloon catheter was advanced high up common hepatic duct and multiple balloon sweeps were made. No further stone was seen the liver and no further stones seen on the cholangiogram.  The scope was then completely withdrawn from the patient and the procedure terminated.     COMPLICATIONS: no immediate complication  ENDOSCOPIC IMPRESSION: large common bile duct stone on initial cholangiogram removed after sphincterotomy. RECOMMENDATIONS: OK to proceed with laparoscopic cholecystectomy.    _______________________________ Rosalie DoctorDorena Cookey, MD 03/14/2013 11:54 AM   CC:

## 2013-03-14 NOTE — Progress Notes (Signed)
[facility]                                                                                Patient Demographics  Vicki Chavez, is a 79 y.o. female, DOB - 01-10-1930, WUJ:[id number]  Admit date - 03/12/2013   Admitting Physician Lynden Oxford, MD  Outpatient Primary MD for the patient is Pcp Not In System  LOS - 2   Chief Complaint  Patient presents with  . Abdominal Pain        Assessment & Plan   Principal Problem:   Obstructive jaundice Active Problems:   Biliary obstruction   h/o Hepatitis E   Elevated LFTs   Itching  Acute abdominal pain secondary to possible choledocholithiasis -Patient has obstructive jaundice -Gastroenterology consulted -Will continue to monitor her LFTs, trending downward -ERCP plan for today -Continue ciprofloxacin and Protonix -Surgery consulted by GI for possible laparoscopic cholecystectomy  History of hepatitis E -Records from June of 2006 show acute hepatitis B with liver biopsy showing acute periportal inflammation, no granulomas, negative trichrome stain consistent with acute hepatitis -Gastroenterology following  Elevated LFTs and biliary obstruction -Likely secondary to above we'll continue to monitor  Hyponatremia -Na 137, improving.   -Continue IVF  Code Status: Full  Family Communication: None at this time.  Disposition Plan: Admitted.  Expected LOS 2-3 midnights.   Procedures  None  Consults   Gastroenterology, Dr. Juanda Chance  DVT Prophylaxis Heparin  Lab Results  Component Value Date   PLT 292 03/13/2013    Medications  Scheduled Meds: . ciprofloxacin  400 mg Intravenous Q12H  . pantoprazole (PROTONIX) IV  40 mg Intravenous Q24H   Continuous Infusions: . sodium chloride 50 mL/hr at 03/14/13 0819   PRN Meds:.ondansetron (ZOFRAN) IV, ondansetron, oxyCODONE  Antibiotics    Anti-infectives   Start     Dose/Rate Route Frequency Ordered Stop   03/13/13 1200  ciprofloxacin (CIPRO) IVPB 400 mg     400  mg 200 mL/hr over 60 Minutes Intravenous Every 12 hours 03/13/13 0646     03/13/13 0015  ciprofloxacin (CIPRO) IVPB 400 mg     400 mg 200 mL/hr over 60 Minutes Intravenous  Once 03/13/13 0000 03/13/13 0143       Time Spent in minutes   25 minutes   Freada Twersky D.O. on 03/14/2013 at 11:11 AM  Between 7am to 7pm - Pager - [phone]  After 7pm go to [email or web address] - password [email or web address]  And look for the night coverage person covering for me after hours  [facility] Office  [phone]    Subjective:   Vicki Chavez seen and examined today.  Patient states her abdominal pain has improved. Patient complains of nausea. Patient denies dizziness, chest pain, shortness of breath, new weakness, numbess, tingling.    Objective:   Filed Vitals:   03/13/13 0549 03/13/13 1330 03/13/13 2143 03/14/13 0627  BP: 113/55 149/62 131/58 121/46  Pulse: 80 71 89 87  Temp: 98.3 F (36.8 C) 98.1 F (36.7 C) 97.9 F (36.6 C) 98 F (36.7 C)  TempSrc: Oral Oral Oral   Resp: 16 16 16 17   Height:      Weight:  SpO2: 100% 98% 100% 100%    Wt Readings from Last 3 Encounters:  03/12/13 50.984 kg (112 lb 6.4 oz)  03/12/13 50.984 kg (112 lb 6.4 oz)    No intake or output data in the 24 hours ending 03/14/13 1111  Exam  General: Well developed, well nourished, NAD, appears stated age  HEENT: NCAT, PERRLA, EOMI,  icteric sclera,  mucous membranes moist. No pharyngeal erythema or exudates  Neck: Supple, no JVD, no masses  Cardiovascular: S1 S2 auscultated, no rubs, murmurs or gallops. Regular rate and rhythm.  Respiratory: Clear to auscultation bilaterally with equal chest rise  Abdomen: Soft, mild tenderness to palpation of the right upper quadrant, nondistended, + bowel sounds  Extremities: warm dry without cyanosis clubbing or edema  Neuro: AAOx3, cranial nerves grossly intact. Strength 5/5 in patient's upper and lower extremities bilaterally  Skin: Without rashes  exudates or nodules  Psych: Normal affect and demeanor with intact judgement and insight  Data Review   Micro Results No results found for this or any previous visit (from the past 240 hour(s)).  Radiology Reports US Abdomen Complete  03/12/2013   CLINICAL DATA:  Abdominal pain.  EXAM: ULTRASOUND ABDOMEN COMPLETE  COMPARISON:  None.  FINDINGS: Gallbladder:  Multiple echogenic structures within the gallbladder are compatible with stones. Mild distention of the gallbladder. No significant gallbladder wall thickening. Reportedly, the patient does not have a sonographic Murphy's sign. Largest gallstone measures 1.2 cm. There are echogenic structures along the gallbladder wall that are suggestive for polyps and the largest measures 0.7 cm.  Common bile duct:  Diameter: Common bile duct is markedly dilated, measuring up to 1.5 cm.  Liver:  No focal lesion identified. Within normal limits in parenchymal echogenicity.  IVC:  No abnormality visualized.  Pancreas:  Visualized portion unremarkable.  Spleen:  Poorly visualized.  Right Kidney:  Length: 9.7 cm. Round hypoechoic structure in the right kidney lower pole measures 2.7 x 1.8 x 1.9 cm. The may be a septation within this structure and findings are suggestive for a cyst.  Left Kidney:  Length: 10.0 cm. There is mild fullness in left renal collecting system.  Abdominal aorta:  No aneurysm visualized.  Other findings:  None.  IMPRESSION: The gallbladder contains multiple echogenic stones. In addition, there appears to be gallbladder polyps.  Marked dilatation of the common bile duct, measuring up to 1.5 cm. Findings raise concern for a distal common bile duct obstruction.  Mild fullness or hydronephrosis in the left kidney of unknown etiology.  The common bile duct and left kidney findings could be further evaluated with CT.  Right renal cyst.  This cyst may be mildly complex as described.   Electronically Signed   By: Richarda Overlie M.D.   On: 03/12/2013 23:35     CBC  Recent Labs Lab 03/12/13 1646 03/13/13 0750  WBC 5.2 5.8  HGB 12.2 11.9*  HCT 35.5* 34.6*  PLT 323 292  MCV 79.2 79.4  MCH 27.2 27.3  MCHC 34.4 34.4  RDW 16.3* 16.2*  LYMPHSABS 1.6  --   MONOABS 0.7  --   EOSABS 0.1  --   BASOSABS 0.0  --     Chemistries   Recent Labs Lab 03/12/13 1646 03/13/13 0750 03/13/13 0830 03/14/13 0540  NA 134* 137  --  137  K 4.0 4.6  --  4.2  CL 98 101  --  102  CO2 25 25  --  23  GLUCOSE 113* 99  --  115*  BUN 20 14  --  13  CREATININE 0.56 0.51  --  0.55  CALCIUM 9.4 9.0  --  8.9  AST 449* 402* 397* 364*  ALT 508* 446* 456* 430*  ALKPHOS 723* 631* 652* 638*  BILITOT 6.4* 6.4* 6.7* 5.7*   ------------------------------------------------------------------------------------------------------------------ estimated creatinine clearance is 39.1 ml/min (by C-G formula based on Cr of 0.55). ------------------------------------------------------------------------------------------------------------------ No results found for this basename: HGBA1C,  in the last 72 hours ------------------------------------------------------------------------------------------------------------------ No results found for this basename: CHOL, HDL, LDLCALC, TRIG, CHOLHDL, LDLDIRECT,  in the last 72 hours ------------------------------------------------------------------------------------------------------------------ No results found for this basename: TSH, T4TOTAL, FREET3, T3FREE, THYROIDAB,  in the last 72 hours ------------------------------------------------------------------------------------------------------------------ No results found for this basename: VITAMINB12, FOLATE, FERRITIN, TIBC, IRON, RETICCTPCT,  in the last 72 hours  Coagulation profile  Recent Labs Lab 03/13/13 0750  INR 0.99    No results found for this basename: DDIMER,  in the last 72 hours  Cardiac Enzymes No results found for this basename: CK, CKMB, TROPONINI,  MYOGLOBIN,  in the last 168 hours ------------------------------------------------------------------------------------------------------------------ No components found with this basename: POCBNP,

## 2013-03-14 NOTE — Consult Note (Signed)
Reason for Consult:gallstones/ CBD stones  Referring Physician: Dr Dorena Cookey MD   Vicki Chavez is an 79 y.o. female.  HPI: Asked to see pt at request of Dr Madilyn Fireman for CBD stone and cholelithiasis.  Admitted 12/5 for jaundice and epigastric discomfort.  Underwent ERCP today for CBD stone.  She is a liitle sore in upper abdomen. FAMILY AT BEDSIDE.  Denies severe abdominal pain  at this point.    Past Medical History  Diagnosis Date  . Hypertension   . High cholesterol   . Osteoporosis   . Prediabetes   . Gall stones     History reviewed. No pertinent past surgical history.  History reviewed. No pertinent family history.  Social History:  reports that she has never smoked. She does not have any smokeless tobacco history on file. She reports that she does not drink alcohol or use illicit drugs.  Allergies:  Allergies  Allergen Reactions  . Penicillins     Does not remember    Medications: I have reviewed the patient's current medications.  Results for orders placed during the hospital encounter of 03/12/13 (from the past 48 hour(s))  URINALYSIS, ROUTINE W REFLEX MICROSCOPIC     Status: Abnormal   Collection Time    03/12/13  4:43 PM      Result Value Range   Color, Urine YELLOW  YELLOW   APPearance CLEAR  CLEAR   Specific Gravity, Urine 1.007  1.005 - 1.030   pH 5.5  5.0 - 8.0   Glucose, UA NEGATIVE  NEGATIVE mg/dL   Hgb urine dipstick NEGATIVE  NEGATIVE   Bilirubin Urine SMALL (*) NEGATIVE   Ketones, ur NEGATIVE  NEGATIVE mg/dL   Protein, ur NEGATIVE  NEGATIVE mg/dL   Urobilinogen, UA 1.0  0.0 - 1.0 mg/dL   Nitrite NEGATIVE  NEGATIVE   Leukocytes, UA NEGATIVE  NEGATIVE   Comment: MICROSCOPIC NOT DONE ON URINES WITH NEGATIVE PROTEIN, BLOOD, LEUKOCYTES, NITRITE, OR GLUCOSE <1000 mg/dL.  CBC WITH DIFFERENTIAL     Status: Abnormal   Collection Time    03/12/13  4:46 PM      Result Value Range   WBC 5.2  4.0 - 10.5 K/uL   RBC 4.48  3.87 - 5.11 MIL/uL   Hemoglobin 12.2   12.0 - 15.0 g/dL   HCT 40.9 (*) 81.1 - 91.4 %   MCV 79.2  78.0 - 100.0 fL   MCH 27.2  26.0 - 34.0 pg   MCHC 34.4  30.0 - 36.0 g/dL   RDW 78.2 (*) 95.6 - 21.3 %   Platelets 323  150 - 400 K/uL   Neutrophils Relative % 55  43 - 77 %   Neutro Abs 2.9  1.7 - 7.7 K/uL   Lymphocytes Relative 30  12 - 46 %   Lymphs Abs 1.6  0.7 - 4.0 K/uL   Monocytes Relative 12  3 - 12 %   Monocytes Absolute 0.7  0.1 - 1.0 K/uL   Eosinophils Relative 2  0 - 5 %   Eosinophils Absolute 0.1  0.0 - 0.7 K/uL   Basophils Relative 1  0 - 1 %   Basophils Absolute 0.0  0.0 - 0.1 K/uL  COMPREHENSIVE METABOLIC PANEL     Status: Abnormal   Collection Time    03/12/13  4:46 PM      Result Value Range   Sodium 134 (*) 135 - 145 mEq/L   Potassium 4.0  3.5 - 5.1 mEq/L  Chloride 98  96 - 112 mEq/L   CO2 25  19 - 32 mEq/L   Glucose, Bld 113 (*) 70 - 99 mg/dL   BUN 20  6 - 23 mg/dL   Creatinine, Ser 1.61  0.50 - 1.10 mg/dL   Calcium 9.4  8.4 - 09.6 mg/dL   Total Protein 7.9  6.0 - 8.3 g/dL   Albumin 3.4 (*) 3.5 - 5.2 g/dL   AST 045 (*) 0 - 37 U/L   ALT 508 (*) 0 - 35 U/L   Alkaline Phosphatase 723 (*) 39 - 117 U/L   Total Bilirubin 6.4 (*) 0.3 - 1.2 mg/dL   GFR calc non Af Amer 85 (*) >90 mL/min   GFR calc Af Amer >90  >90 mL/min   Comment: (NOTE)     The eGFR has been calculated using the CKD EPI equation.     This calculation has not been validated in all clinical situations.     eGFR's persistently <90 mL/min signify possible Chronic Kidney     Disease.  LIPASE, BLOOD     Status: Abnormal   Collection Time    03/12/13  4:46 PM      Result Value Range   Lipase 86 (*) 11 - 59 U/L  COMPREHENSIVE METABOLIC PANEL     Status: Abnormal   Collection Time    03/13/13  7:50 AM      Result Value Range   Sodium 137  135 - 145 mEq/L   Potassium 4.6  3.5 - 5.1 mEq/L   Comment: HEMOLYSIS AT THIS LEVEL MAY AFFECT RESULT   Chloride 101  96 - 112 mEq/L   CO2 25  19 - 32 mEq/L   Glucose, Bld 99  70 - 99 mg/dL   BUN  14  6 - 23 mg/dL   Creatinine, Ser 4.09  0.50 - 1.10 mg/dL   Calcium 9.0  8.4 - 81.1 mg/dL   Total Protein 7.2  6.0 - 8.3 g/dL   Albumin 3.1 (*) 3.5 - 5.2 g/dL   AST 914 (*) 0 - 37 U/L   Comment: HEMOLYSIS AT THIS LEVEL MAY AFFECT RESULT   ALT 446 (*) 0 - 35 U/L   Comment: HEMOLYSIS AT THIS LEVEL MAY AFFECT RESULT   Alkaline Phosphatase 631 (*) 39 - 117 U/L   Total Bilirubin 6.4 (*) 0.3 - 1.2 mg/dL   GFR calc non Af Amer 88 (*) >90 mL/min   GFR calc Af Amer >90  >90 mL/min   Comment: (NOTE)     The eGFR has been calculated using the CKD EPI equation.     This calculation has not been validated in all clinical situations.     eGFR's persistently <90 mL/min signify possible Chronic Kidney     Disease.  CBC     Status: Abnormal   Collection Time    03/13/13  7:50 AM      Result Value Range   WBC 5.8  4.0 - 10.5 K/uL   RBC 4.36  3.87 - 5.11 MIL/uL   Hemoglobin 11.9 (*) 12.0 - 15.0 g/dL   HCT 78.2 (*) 95.6 - 21.3 %   MCV 79.4  78.0 - 100.0 fL   MCH 27.3  26.0 - 34.0 pg   MCHC 34.4  30.0 - 36.0 g/dL   RDW 08.6 (*) 57.8 - 46.9 %   Platelets 292  150 - 400 K/uL  PROTIME-INR     Status: None   Collection Time  03/13/13  7:50 AM      Result Value Range   Prothrombin Time 12.9  11.6 - 15.2 seconds   INR 0.99  0.00 - 1.49  HEPATIC FUNCTION PANEL     Status: Abnormal   Collection Time    03/13/13  8:30 AM      Result Value Range   Total Protein 7.1  6.0 - 8.3 g/dL   Albumin 3.1 (*) 3.5 - 5.2 g/dL   AST 161 (*) 0 - 37 U/L   ALT 456 (*) 0 - 35 U/L   Alkaline Phosphatase 652 (*) 39 - 117 U/L   Total Bilirubin 6.7 (*) 0.3 - 1.2 mg/dL   Bilirubin, Direct 5.0 (*) 0.0 - 0.3 mg/dL   Indirect Bilirubin 1.7 (*) 0.3 - 0.9 mg/dL  AMYLASE     Status: None   Collection Time    03/13/13  8:30 AM      Result Value Range   Amylase 101  0 - 105 U/L  LIPASE, BLOOD     Status: None   Collection Time    03/13/13  8:30 AM      Result Value Range   Lipase 51  11 - 59 U/L  HEPATITIS PANEL,  ACUTE     Status: None   Collection Time    03/13/13  8:30 AM      Result Value Range   Hepatitis B Surface Ag NEGATIVE  NEGATIVE   HCV Ab NEGATIVE  NEGATIVE   Hep A IgM NON REACTIVE  NON REACTIVE   Hep B C IgM NON REACTIVE  NON REACTIVE   Comment: (NOTE)     High levels of Hepatitis B Core IgM antibody are detectable     during the acute stage of Hepatitis B. This antibody is used     to differentiate current from past HBV infection.     Performed at [facility]  COMPREHENSIVE METABOLIC PANEL     Status: Abnormal   Collection Time    03/14/13  5:40 AM      Result Value Range   Sodium 137  135 - 145 mEq/L   Potassium 4.2  3.5 - 5.1 mEq/L   Chloride 102  96 - 112 mEq/L   CO2 23  19 - 32 mEq/L   Glucose, Bld 115 (*) 70 - 99 mg/dL   BUN 13  6 - 23 mg/dL   Creatinine, Ser 0.96  0.50 - 1.10 mg/dL   Calcium 8.9  8.4 - 04.5 mg/dL   Total Protein 6.7  6.0 - 8.3 g/dL   Albumin 2.9 (*) 3.5 - 5.2 g/dL   AST 409 (*) 0 - 37 U/L   ALT 430 (*) 0 - 35 U/L   Alkaline Phosphatase 638 (*) 39 - 117 U/L   Total Bilirubin 5.7 (*) 0.3 - 1.2 mg/dL   GFR calc non Af Amer 86 (*) >90 mL/min   GFR calc Af Amer >90  >90 mL/min   Comment: (NOTE)     The eGFR has been calculated using the CKD EPI equation.     This calculation has not been validated in all clinical situations.     eGFR's persistently <90 mL/min signify possible Chronic Kidney     Disease.    US Abdomen Complete  03/12/2013   CLINICAL DATA:  Abdominal pain.  EXAM: ULTRASOUND ABDOMEN COMPLETE  COMPARISON:  None.  FINDINGS: Gallbladder:  Multiple echogenic structures within the gallbladder are compatible with stones. Mild distention of  the gallbladder. No significant gallbladder wall thickening. Reportedly, the patient does not have a sonographic Murphy's sign. Largest gallstone measures 1.2 cm. There are echogenic structures along the gallbladder wall that are suggestive for polyps and the largest measures 0.7 cm.  Common bile duct:   Diameter: Common bile duct is markedly dilated, measuring up to 1.5 cm.  Liver:  No focal lesion identified. Within normal limits in parenchymal echogenicity.  IVC:  No abnormality visualized.  Pancreas:  Visualized portion unremarkable.  Spleen:  Poorly visualized.  Right Kidney:  Length: 9.7 cm. Round hypoechoic structure in the right kidney lower pole measures 2.7 x 1.8 x 1.9 cm. The may be a septation within this structure and findings are suggestive for a cyst.  Left Kidney:  Length: 10.0 cm. There is mild fullness in left renal collecting system.  Abdominal aorta:  No aneurysm visualized.  Other findings:  None.  IMPRESSION: The gallbladder contains multiple echogenic stones. In addition, there appears to be gallbladder polyps.  Marked dilatation of the common bile duct, measuring up to 1.5 cm. Findings raise concern for a distal common bile duct obstruction.  Mild fullness or hydronephrosis in the left kidney of unknown etiology.  The common bile duct and left kidney findings could be further evaluated with CT.  Right renal cyst.  This cyst may be mildly complex as described.   Electronically Signed   By: Richarda Overlie M.D.   On: 03/12/2013 23:35    Review of Systems  Constitutional: Negative.   Eyes: Negative.   Respiratory: Negative.   Cardiovascular: Negative.   Gastrointestinal: Positive for abdominal pain.  Genitourinary: Negative.   Musculoskeletal: Negative.   Skin: Positive for itching.  Neurological: Negative.   Endo/Heme/Allergies: Negative.   Psychiatric/Behavioral: Negative.    Blood pressure 156/59, pulse 74, temperature 98 F (36.7 C), temperature source Oral, resp. rate 18, height 4\' 10"  (1.473 m), weight 112 lb 6.4 oz (50.984 kg), SpO2 100.00%. Physical Exam  Constitutional: She is oriented to person, place, and time. She appears well-developed.  HENT:  Head: Normocephalic and atraumatic.  Eyes: Scleral icterus is present.  Cardiovascular: Normal rate.   Respiratory:  Effort normal.  GI: She exhibits no distension. There is tenderness.  Musculoskeletal: Normal range of motion.  Neurological: She is alert and oriented to person, place, and time.  Skin: Skin is warm and dry.  Psychiatric: She has a normal mood and affect. Her behavior is normal. Judgment and thought content normal.    Assessment/Plan: choledocolithiasis s/p ERCP   Gallstones  Recommend Lap chole IOC to pt and family during this admission.  Offered surgery today since she is NPO but family wished to wait until am since some concern for post op pancreatitis.   Will check labs in AM and post for Monday for DOW MD.    Kolson Chovanec A. 03/14/2013, 1:04 PM

## 2013-03-15 ENCOUNTER — Encounter (HOSPITAL_COMMUNITY): Payer: Self-pay | Admitting: Gastroenterology

## 2013-03-15 LAB — COMPREHENSIVE METABOLIC PANEL
ALT: 345 U/L — ABNORMAL HIGH (ref 0–35)
AST: 262 U/L — ABNORMAL HIGH (ref 0–37)
AST: 230 U/L — ABNORMAL HIGH (ref 0–37)
Albumin: 3 g/dL — ABNORMAL LOW (ref 3.5–5.2)
Alkaline Phosphatase: 568 U/L — ABNORMAL HIGH (ref 39–117)
Alkaline Phosphatase: 581 U/L — ABNORMAL HIGH (ref 39–117)
BUN: 10 mg/dL (ref 6–23)
BUN: 10 mg/dL (ref 6–23)
Calcium: 8.7 mg/dL (ref 8.4–10.5)
Chloride: 103 mEq/L (ref 96–112)
GFR calc Af Amer: >90 mL/min (ref >90)
Glucose, Bld: 104 mg/dL — ABNORMAL HIGH (ref 70–99)
Glucose, Bld: 116 mg/dL — ABNORMAL HIGH (ref 70–99)
Potassium: 4.1 mEq/L (ref 3.5–5.1)
Potassium: 4.3 mEq/L (ref 3.5–5.1)
Sodium: 135 mEq/L (ref 135–145)
Sodium: 136 mEq/L (ref 135–145)
Total Bilirubin: 6.2 mg/dL — ABNORMAL HIGH (ref 0.3–1.2)
Total Bilirubin: 4.4 mg/dL — ABNORMAL HIGH (ref 0.3–1.2)
Total Protein: 6.4 g/dL (ref 6.0–8.3)
Total Protein: 6.8 g/dL (ref 6.0–8.3)

## 2013-03-15 LAB — GLUCOSE, CAPILLARY

## 2013-03-15 LAB — SURGICAL PCR SCREEN
MRSA, PCR: NEGATIVE
Staphylococcus aureus: NEGATIVE

## 2013-03-15 MED ORDER — TRAMADOL HCL 50 MG PO TABS
25.0000 mg | ORAL_TABLET | Freq: Once | ORAL | Status: AC
  Administered 2013-03-15: 25 mg via ORAL
  Filled 2013-03-15 (×2): qty 1

## 2013-03-15 MED ORDER — TRAMADOL HCL 50 MG PO TABS
25.0000 mg | ORAL_TABLET | Freq: Four times a day (QID) | ORAL | Status: DC | PRN
  Filled 2013-03-15: qty 1

## 2013-03-15 MED ORDER — TRAMADOL HCL 50 MG PO TABS: 50.0000 mg | ORAL_TABLET | Freq: Once | ORAL | Status: DC

## 2013-03-15 NOTE — Progress Notes (Signed)
[facility]                                                                                Patient Demographics  Vicki Chavez, is a 79 y.o. female, DOB - [birth year], ZOX:[id number]  Admit date - 03/12/2013   Admitting Physician Lynden Oxford, MD  Outpatient Primary MD for the patient is Pcp Not In System  LOS - 3   Chief Complaint  Patient presents with  . Abdominal Pain        Assessment & Plan   Principal Problem:   Obstructive jaundice Active Problems:   Biliary obstruction   h/o Hepatitis E   Elevated LFTs   Itching  Acute abdominal pain secondary to possible choledocholithiasis -Patient has obstructive jaundice -Gastroenterology consulted -Will continue to monitor her LFTs, trending downward -ERCP 12/7.  Large distal stone with diffuse biliary tree dilatation -Continue ciprofloxacin and Protonix -Surgery consulted for lap chole (planned for today)  History of hepatitis E -Records from June of 2006 show acute hepatitis B with liver biopsy showing acute periportal inflammation, no granulomas, negative trichrome stain consistent with acute hepatitis -Gastroenterology following  Elevated LFTs and biliary obstruction -Trending downward  Hyponatremia -Na 136, improving.   -Continue IVF  Code Status: Full  Family Communication: Daughter at bedside  Disposition Plan: Admitted.    Procedures  ERCP with sphincteromtomy  Consults   Gastroenterology, Dr. Juanda Chance General surgery  DVT Prophylaxis Heparin  Lab Results  Component Value Date   PLT 292 03/13/2013    Medications  Scheduled Meds: . ciprofloxacin  400 mg Intravenous Q12H  . ciprofloxacin  400 mg Intravenous On Call to OR  . pantoprazole (PROTONIX) IV  40 mg Intravenous Q24H   Continuous Infusions: . sodium chloride 50 mL/hr at 03/14/13 0819  . sodium chloride     PRN Meds:.hydrALAZINE, ondansetron (ZOFRAN) IV, ondansetron, oxyCODONE  Antibiotics    Anti-infectives   Start      Dose/Rate Route Frequency Ordered Stop   03/15/13 0600  ciprofloxacin (CIPRO) IVPB 400 mg     400 mg 200 mL/hr over 60 Minutes Intravenous On call to O.R. 03/14/13 1319 03/16/13 0559   03/13/13 1200  ciprofloxacin (CIPRO) IVPB 400 mg     400 mg 200 mL/hr over 60 Minutes Intravenous Every 12 hours 03/13/13 0646     03/13/13 0015  ciprofloxacin (CIPRO) IVPB 400 mg     400 mg 200 mL/hr over 60 Minutes Intravenous  Once 03/13/13 0000 03/13/13 0143       Time Spent in minutes   25 minutes   Vicki Chavez D.O. on 03/15/2013 at 10:59 AM  Between 7am to 7pm - Pager - [phone]  After 7pm go to [email or web address] - password [email or web address]  And look for the night coverage person covering for me after hours  [facility] Office  [phone]    Subjective:   Vicki Chavez seen and examined today.  Patient states her abdominal pain has improved. She does have some complaints of numbness in her feet, but does not wish to take any medication. Her nausea has improved. Patient denies dizziness, chest pain, shortness of breath, new weakness,  tingling.    Objective:  Filed Vitals:   03/14/13 1545 03/14/13 1820 03/14/13 2119 03/15/13 0522  BP: 152/61 162/62 127/51 121/52  Pulse: 74 83 85 96  Temp: 97.4 F (36.3 C) 97.5 F (36.4 C) 97.9 F (36.6 C) 98 F (36.7 C)  TempSrc: Oral Oral Oral   Resp: 18 18 18 17   Height:      Weight:      SpO2: 100% 100% 100% 96%    Wt Readings from Last 3 Encounters:  03/12/13 50.984 kg (112 lb 6.4 oz)  03/12/13 50.984 kg (112 lb 6.4 oz)     Intake/Output Summary (Last 24 hours) at 03/15/13 1059 Last data filed at 03/15/13 0900  Gross per 24 hour  Intake 3017.67 ml  Output   1700 ml  Net 1317.67 ml    Exam  General: Well developed, well nourished, NAD, appears stated age  HEENT: NCAT, icteric sclera,  mucous membranes moist.   Neck: Supple, no JVD, no masses  Cardiovascular: S1 S2 auscultated, no rubs, murmurs or gallops. Regular  rate and rhythm.  Respiratory: Clear to auscultation bilaterally with equal chest rise  Abdomen: Soft, mild tenderness to palpation of the right upper quadrant, nondistended, + bowel sounds  Extremities: warm dry without cyanosis clubbing or edema  Neuro: AAOx3, cranial nerves grossly intact.   Skin: Without rashes exudates or nodules  Psych: Normal affect and demeanor with intact judgement and insight  Data Review   Micro Results Recent Results (from the past 240 hour(s))  SURGICAL PCR SCREEN     Status: None   Collection Time    03/15/13  5:44 AM      Result Value Range Status   MRSA, PCR NEGATIVE  NEGATIVE Final   Staphylococcus aureus NEGATIVE  NEGATIVE Final   Comment:            The Xpert SA Assay (FDA     approved for NASAL specimens     in patients over 63 years of age),     is one component of     a comprehensive surveillance     program.  Test performance has     been validated by [employer] for patients greater     than or equal to 13 year old.     It is not intended     to diagnose infection nor to     guide or monitor treatment.    Radiology Reports US Abdomen Complete  03/12/2013   CLINICAL DATA:  Abdominal pain.  EXAM: ULTRASOUND ABDOMEN COMPLETE  COMPARISON:  None.  FINDINGS: Gallbladder:  Multiple echogenic structures within the gallbladder are compatible with stones. Mild distention of the gallbladder. No significant gallbladder wall thickening. Reportedly, the patient does not have a sonographic Murphy's sign. Largest gallstone measures 1.2 cm. There are echogenic structures along the gallbladder wall that are suggestive for polyps and the largest measures 0.7 cm.  Common bile duct:  Diameter: Common bile duct is markedly dilated, measuring up to 1.5 cm.  Liver:  No focal lesion identified. Within normal limits in parenchymal echogenicity.  IVC:  No abnormality visualized.  Pancreas:  Visualized portion unremarkable.  Spleen:  Poorly visualized.  Right  Kidney:  Length: 9.7 cm. Round hypoechoic structure in the right kidney lower pole measures 2.7 x 1.8 x 1.9 cm. The may be a septation within this structure and findings are suggestive for a cyst.  Left Kidney:  Length: 10.0 cm. There is mild fullness in left renal collecting  system.  Abdominal aorta:  No aneurysm visualized.  Other findings:  None.  IMPRESSION: The gallbladder contains multiple echogenic stones. In addition, there appears to be gallbladder polyps.  Marked dilatation of the common bile duct, measuring up to 1.5 cm. Findings raise concern for a distal common bile duct obstruction.  Mild fullness or hydronephrosis in the left kidney of unknown etiology.  The common bile duct and left kidney findings could be further evaluated with CT.  Right renal cyst.  This cyst may be mildly complex as described.   Electronically Signed   By: Richarda Overlie M.D.   On: 03/12/2013 23:35    CBC  Recent Labs Lab 03/12/13 1646 03/13/13 0750  WBC 5.2 5.8  HGB 12.2 11.9*  HCT 35.5* 34.6*  PLT 323 292  MCV 79.2 79.4  MCH 27.2 27.3  MCHC 34.4 34.4  RDW 16.3* 16.2*  LYMPHSABS 1.6  --   MONOABS 0.7  --   EOSABS 0.1  --   BASOSABS 0.0  --     Chemistries   Recent Labs Lab 03/12/13 1646 03/13/13 0750 03/13/13 0830 03/14/13 0540 03/15/13 0045 03/15/13 0655  NA 134* 137  --  137 135 136  K 4.0 4.6  --  4.2 4.1 4.3  CL 98 101  --  102 103 103  CO2 25 25  --  23 22 26   GLUCOSE 113* 99  --  115* 104* 116*  BUN 20 14  --  13 10 10   CREATININE 0.56 0.51  --  0.55 0.50 0.65  CALCIUM 9.4 9.0  --  8.9 8.7 9.1  AST 449* 402* 397* 364* 262* 230*  ALT 508* 446* 456* 430* 364* 345*  ALKPHOS 723* 631* 652* 638* 568* 581*  BILITOT 6.4* 6.4* 6.7* 5.7* 6.2* 4.4*   ------------------------------------------------------------------------------------------------------------------ estimated creatinine clearance is 39.1 ml/min (by C-G formula based on Cr of  0.65). ------------------------------------------------------------------------------------------------------------------ No results found for this basename: HGBA1C,  in the last 72 hours ------------------------------------------------------------------------------------------------------------------ No results found for this basename: CHOL, HDL, LDLCALC, TRIG, CHOLHDL, LDLDIRECT,  in the last 72 hours ------------------------------------------------------------------------------------------------------------------ No results found for this basename: TSH, T4TOTAL, FREET3, T3FREE, THYROIDAB,  in the last 72 hours ------------------------------------------------------------------------------------------------------------------ No results found for this basename: VITAMINB12, FOLATE, FERRITIN, TIBC, IRON, RETICCTPCT,  in the last 72 hours  Coagulation profile  Recent Labs Lab 03/13/13 0750  INR 0.99    No results found for this basename: DDIMER,  in the last 72 hours  Cardiac Enzymes No results found for this basename: CK, CKMB, TROPONINI, MYOGLOBIN,  in the last 168 hours ------------------------------------------------------------------------------------------------------------------ No components found with this basename: POCBNP,

## 2013-03-15 NOTE — Progress Notes (Signed)
1 Day Post-Op  Subjective: C/o headache.  No abd pain, n/v.  Daughter at bedside.    Objective: Vital signs in last 24 hours: Temp:  [97.4 F (36.3 C)-98 F (36.7 C)] 98 F (36.7 C) (12/08 0522) Pulse Rate:  [74-96] 96 (12/08 0522) Resp:  [0-18] 17 (12/08 0522) BP: (121-246)/(51-160) 121/52 mmHg (12/08 0522) SpO2:  [96 %-100 %] 96 % (12/08 0522) Last BM Date: 03/14/13  Intake/Output from previous day: 12/07 0701 - 12/08 0700 In: 2677.7 [P.O.:1240; I.V.:837.7; IV Piggyback:600] Out: 1350 [Urine:1350] Intake/Output this shift: Total I/O In: 340 [P.O.:340] Out: 350 [Urine:350]  PE General appearance: alert, cooperative, appears stated age and no distress GI: soft, non-tender; bowel sounds normal; no masses,  no organomegaly  Lab Results:   Recent Labs  03/12/13 1646 03/13/13 0750  WBC 5.2 5.8  HGB 12.2 11.9*  HCT 35.5* 34.6*  PLT 323 292   BMET  Recent Labs  03/15/13 0045 03/15/13 0655  NA 135 136  K 4.1 4.3  CL 103 103  CO2 22 26  GLUCOSE 104* 116*  BUN 10 10  CREATININE 0.50 0.65  CALCIUM 8.7 9.1   PT/INR  Recent Labs  03/13/13 0750  LABPROT 12.9  INR 0.99   ABG No results found for this basename: PHART, PCO2, PO2, HCO3,  in the last 72 hours  Studies/Results: Dg Ercp Biliary & Pancreatic Ducts  03/14/2013   CLINICAL DATA:  Biliary obstruction with obstructive jaundice.  EXAM: ERCP with sphincterotomy  TECHNIQUE: Multiple spot images obtained with the fluoroscopic device and submitted for interpretation post-procedure.  COMPARISON:  Abdomen ultrasound dated 03/12/2013.  FINDINGS: Endoscope in place with contrast opacification of a diffusely dilated common duct. Mildly dilated intrahepatic ducts. Dilated distal cystic duct without opacification of the gallbladder. Multiple filling defects in the common duct on some of the images. Some of these are compatible with introduced air bubbles and some are compatible with at least 1 gallstone in the common  duct. Stones were reportedly removed following sphincterotomy.  IMPRESSION: Choledocholithiasis with ductal dilatation and stone removal as described above.  These images were submitted for radiologic interpretation only. Please see the procedural report for the amount of contrast and the fluoroscopy time utilized.   Electronically Signed   By: Gordan Payment M.D.   On: 03/14/2013 18:59    Anti-infectives: Anti-infectives   Start     Dose/Rate Route Frequency Ordered Stop   03/15/13 0600  ciprofloxacin (CIPRO) IVPB 400 mg     400 mg 200 mL/hr over 60 Minutes Intravenous On call to O.R. 03/14/13 1319 03/16/13 0559   03/13/13 1200  ciprofloxacin (CIPRO) IVPB 400 mg     400 mg 200 mL/hr over 60 Minutes Intravenous Every 12 hours 03/13/13 0646     03/13/13 0015  ciprofloxacin (CIPRO) IVPB 400 mg     400 mg 200 mL/hr over 60 Minutes Intravenous  Once 03/13/13 0000 03/13/13 0143      Assessment/Plan: choledocolithiasis s/p ERCP  Gallstones LFTs trending down.  Proceed with laparoscopic cholecystectomy, tomorrow at 09:30AM. May have clears, NPO after midnight.  C/w atbx, Hold lovenox/heparin  Consent signed   LOS: 3 days   Houa Nie ANP-BC 03/15/2013  11:13 AM

## 2013-03-15 NOTE — Progress Notes (Signed)
General Surgery [facility]  Patient seen and examined.  Daughter at bedside.  Plan lap chole with IOC tomorrow AM.  The risks and benefits of the procedure have been discussed at length with the patient.  The patient understands the proposed procedure, potential alternative treatments, and the course of recovery to be expected.  All of the patient's questions have been answered at this time.  The patient wishes to proceed with surgery.  Velora Heckler, MD, [facility] Office: [phone]

## 2013-03-15 NOTE — Progress Notes (Signed)
Patient ID: Vicki Chavez, female   DOB: [birth year], 79 y.o.   MRN: [id number] [facility] Gastroenterology Progress Note  Vicki Chavez 79 y.o. 07/25/1929   Subjective: Sitting up in bed. Daughter at bedside. Denies abdominal pain overnight or now. Had nausea without vomiting overnight and given Zofran to help.  Objective: Vital signs in last 24 hours: Filed Vitals:   03/15/13 0522  BP: 121/52  Pulse: 96  Temp: 98 F (36.7 C)  Resp: 17    Physical Exam: Gen: alert, no acute distress Abd: LLQ and periumbilical tenderness with voluntary guarding, epigastric tenderness without guarding, soft, nondistended, +BS  Lab Results:  Recent Labs  03/15/13 0045 03/15/13 0655  NA 135 136  K 4.1 4.3  CL 103 103  CO2 22 26  GLUCOSE 104* 116*  BUN 10 10  CREATININE 0.50 0.65  CALCIUM 8.7 9.1    Recent Labs  03/15/13 0045 03/15/13 0655  AST 262* 230*  ALT 364* 345*  ALKPHOS 568* 581*  BILITOT 6.2* 4.4*  PROT 6.4 6.8  ALBUMIN 2.8* 3.0*    Recent Labs  03/12/13 1646 03/13/13 0750  WBC 5.2 5.8  NEUTROABS 2.9  --   HGB 12.2 11.9*  HCT 35.5* 34.6*  MCV 79.2 79.4  PLT 323 292    Recent Labs  03/13/13 0750  LABPROT 12.9  INR 0.99   Lipase 39   Assessment/Plan: 79 yo s/p ERCP with removal of CBD stone with improving LFTs. I do not think she has post-ERCP pancreatitis at this time and agree with Dr. Madilyn Fireman to proceed with lap chole when ok with surgery. Continue supportive care.    Taequan Stockhausen C. 03/15/2013, 11:37 AM

## 2013-03-16 ENCOUNTER — Encounter (HOSPITAL_COMMUNITY): Payer: Self-pay | Admitting: Surgery

## 2013-03-16 ENCOUNTER — Inpatient Hospital Stay (HOSPITAL_COMMUNITY): Payer: 59 | Admitting: Anesthesiology

## 2013-03-16 ENCOUNTER — Encounter (HOSPITAL_COMMUNITY)
Admission: EM | Disposition: A | Discharge status: Home / Self Care | Payer: Self-pay | Source: Home / Self Care | Attending: Internal Medicine

## 2013-03-16 ENCOUNTER — Inpatient Hospital Stay (HOSPITAL_COMMUNITY): Payer: 59

## 2013-03-16 ENCOUNTER — Encounter (HOSPITAL_COMMUNITY): Payer: 59 | Admitting: Anesthesiology

## 2013-03-16 DIAGNOSIS — K8020 Calculus of gallbladder without cholecystitis without obstruction: Secondary | ICD-10-CM | POA: Diagnosis present

## 2013-03-16 DIAGNOSIS — K8001 Calculus of gallbladder with acute cholecystitis with obstruction: Secondary | ICD-10-CM | POA: Diagnosis not present

## 2013-03-16 DIAGNOSIS — R1011 Right upper quadrant pain: Secondary | ICD-10-CM | POA: Diagnosis not present

## 2013-03-16 DIAGNOSIS — K8010 Calculus of gallbladder with chronic cholecystitis without obstruction: Secondary | ICD-10-CM

## 2013-03-16 HISTORY — PX: CHOLECYSTECTOMY: SHX55

## 2013-03-16 LAB — CBC
HCT: 35.6 % — ABNORMAL LOW (ref 36.0–46.0)
Hemoglobin: 12 g/dL (ref 12.0–15.0)
MCHC: 33.7 g/dL (ref 30.0–36.0)
Platelets: 327 10*3/uL (ref 150–400)
RDW: 16.6 % — ABNORMAL HIGH (ref 11.5–15.5)
WBC: 5.6 10*3/uL (ref 4.0–10.5)

## 2013-03-16 LAB — BASIC METABOLIC PANEL
Calcium: 9.3 mg/dL (ref 8.4–10.5)
Chloride: 101 mEq/L (ref 96–112)
GFR calc Af Amer: >90 mL/min (ref >90)
GFR calc non Af Amer: 82 mL/min — ABNORMAL LOW (ref >90)
Potassium: 3.7 mEq/L (ref 3.5–5.1)
Sodium: 136 mEq/L (ref 135–145)

## 2013-03-16 LAB — GLUCOSE, CAPILLARY

## 2013-03-16 LAB — LIPASE, BLOOD: Lipase: 41 U/L (ref 11–59)

## 2013-03-16 SURGERY — LAPAROSCOPIC CHOLECYSTECTOMY WITH INTRAOPERATIVE CHOLANGIOGRAM
Anesthesia: General | Site: Abdomen

## 2013-03-16 MED ORDER — BUPIVACAINE-EPINEPHRINE (PF) 0.25% -1:200000 IJ SOLN
INTRAMUSCULAR | Status: AC
  Filled 2013-03-16: qty 30

## 2013-03-16 MED ORDER — KCL IN DEXTROSE-NACL 20-5-0.45 MEQ/L-%-% IV SOLN
INTRAVENOUS | Status: DC
  Administered 2013-03-16: 15:00:00 via INTRAVENOUS
  Filled 2013-03-16 (×4): qty 1000

## 2013-03-16 MED ORDER — SODIUM CHLORIDE 0.9 % IJ SOLN
INTRAMUSCULAR | Status: AC
  Filled 2013-03-16: qty 9

## 2013-03-16 MED ORDER — PROPOFOL 10 MG/ML IV BOLUS
INTRAVENOUS | Status: DC | PRN
  Administered 2013-03-16: 80 mg via INTRAVENOUS
  Administered 2013-03-16: 50 mg via INTRAVENOUS

## 2013-03-16 MED ORDER — ARTIFICIAL TEARS OP OINT
TOPICAL_OINTMENT | OPHTHALMIC | Status: DC | PRN
  Administered 2013-03-16: 1 via OPHTHALMIC

## 2013-03-16 MED ORDER — PROMETHAZINE HCL 25 MG/ML IJ SOLN
INTRAMUSCULAR | Status: AC
  Filled 2013-03-16: qty 1

## 2013-03-16 MED ORDER — LIDOCAINE HCL (CARDIAC) 20 MG/ML IV SOLN
INTRAVENOUS | Status: DC | PRN
  Administered 2013-03-16: 50 mg via INTRAVENOUS

## 2013-03-16 MED ORDER — PHENYLEPHRINE HCL 10 MG/ML IJ SOLN
INTRAMUSCULAR | Status: DC | PRN
  Administered 2013-03-16 (×2): 120 ug via INTRAVENOUS

## 2013-03-16 MED ORDER — FENTANYL CITRATE 0.05 MG/ML IJ SOLN
25.0000 ug | INTRAMUSCULAR | Status: AC | PRN
  Administered 2013-03-16: 50 ug via INTRAVENOUS
  Administered 2013-03-16 (×5): 25 ug via INTRAVENOUS

## 2013-03-16 MED ORDER — ACETAMINOPHEN 325 MG PO TABS: 650.0000 mg | ORAL_TABLET | ORAL | Status: DC | PRN

## 2013-03-16 MED ORDER — ONDANSETRON HCL 4 MG PO TABS: 4.0000 mg | ORAL_TABLET | Freq: Four times a day (QID) | ORAL | Status: DC | PRN

## 2013-03-16 MED ORDER — FENTANYL CITRATE 0.05 MG/ML IJ SOLN
INTRAMUSCULAR | Status: AC
  Administered 2013-03-16: 25 ug
  Filled 2013-03-16: qty 2

## 2013-03-16 MED ORDER — GLYCOPYRROLATE 0.2 MG/ML IJ SOLN
INTRAMUSCULAR | Status: DC | PRN
  Administered 2013-03-16: 0.4 mg via INTRAVENOUS

## 2013-03-16 MED ORDER — FENTANYL CITRATE 0.05 MG/ML IJ SOLN
INTRAMUSCULAR | Status: DC | PRN
  Administered 2013-03-16: 50 ug via INTRAVENOUS
  Administered 2013-03-16 (×2): 25 ug via INTRAVENOUS

## 2013-03-16 MED ORDER — LACTATED RINGERS IV SOLN
INTRAVENOUS | Status: DC
  Administered 2013-03-16: 10:00:00 via INTRAVENOUS

## 2013-03-16 MED ORDER — NEOSTIGMINE METHYLSULFATE 1 MG/ML IJ SOLN
INTRAMUSCULAR | Status: DC | PRN
  Administered 2013-03-16: 3 mg via INTRAVENOUS

## 2013-03-16 MED ORDER — ONDANSETRON HCL 4 MG/2ML IJ SOLN
INTRAMUSCULAR | Status: DC | PRN
  Administered 2013-03-16: 4 mg via INTRAVENOUS

## 2013-03-16 MED ORDER — 0.9 % SODIUM CHLORIDE (POUR BTL) OPTIME
TOPICAL | Status: DC | PRN
  Administered 2013-03-16: 1000 mL

## 2013-03-16 MED ORDER — PROMETHAZINE HCL 25 MG/ML IJ SOLN: 6.2500 mg | INTRAMUSCULAR | Status: DC | PRN

## 2013-03-16 MED ORDER — SODIUM CHLORIDE 0.9 % IV SOLN
INTRAVENOUS | Status: DC | PRN
  Administered 2013-03-16: 11:00:00

## 2013-03-16 MED ORDER — HYDROCODONE-ACETAMINOPHEN 5-325 MG PO TABS: 1.0000 | ORAL_TABLET | ORAL | Status: DC | PRN

## 2013-03-16 MED ORDER — HYDROMORPHONE HCL PF 1 MG/ML IJ SOLN
1.0000 mg | INTRAMUSCULAR | Status: DC | PRN
  Administered 2013-03-16 – 2013-03-17 (×3): 1 mg via INTRAVENOUS
  Filled 2013-03-16 (×3): qty 1

## 2013-03-16 MED ORDER — BUPIVACAINE-EPINEPHRINE 0.25% -1:200000 IJ SOLN
INTRAMUSCULAR | Status: DC | PRN
  Administered 2013-03-16: 20 mL

## 2013-03-16 MED ORDER — ROCURONIUM BROMIDE 100 MG/10ML IV SOLN
INTRAVENOUS | Status: DC | PRN
  Administered 2013-03-16: 5 mg via INTRAVENOUS
  Administered 2013-03-16: 35 mg via INTRAVENOUS

## 2013-03-16 MED ORDER — ONDANSETRON HCL 4 MG/2ML IJ SOLN: 4.0000 mg | Freq: Four times a day (QID) | INTRAMUSCULAR | Status: DC | PRN

## 2013-03-16 MED ORDER — SODIUM CHLORIDE 0.9 % IR SOLN
Status: DC | PRN
  Administered 2013-03-16: 1000 mL

## 2013-03-16 SURGICAL SUPPLY — 44 items
APPLIER CLIP ROT 10 11.4 M/L (STAPLE) ×2
BENZOIN TINCTURE PRP APPL 2/3 (GAUZE/BANDAGES/DRESSINGS) ×2 IMPLANT
CANISTER SUCTION 2500CC (MISCELLANEOUS) ×2 IMPLANT
CHLORAPREP W/TINT 26ML (MISCELLANEOUS) ×2 IMPLANT
CLIP APPLIE ROT 10 11.4 M/L (STAPLE) ×1 IMPLANT
CLSR STERI-STRIP ANTIMIC 1/2X4 (GAUZE/BANDAGES/DRESSINGS) ×2 IMPLANT
COVER MAYO STAND STRL (DRAPES) ×2 IMPLANT
COVER SURGICAL LIGHT HANDLE (MISCELLANEOUS) ×2 IMPLANT
DECANTER SPIKE VIAL GLASS SM (MISCELLANEOUS) ×2 IMPLANT
DRAPE C-ARM 42X72 X-RAY (DRAPES) ×2 IMPLANT
DRAPE UTILITY 15X26 W/TAPE STR (DRAPE) ×4 IMPLANT
ELECT REM PT RETURN 9FT ADLT (ELECTROSURGICAL) ×2
ELECTRODE REM PT RTRN 9FT ADLT (ELECTROSURGICAL) ×1 IMPLANT
GAUZE SPONGE 2X2 8PLY STRL LF (GAUZE/BANDAGES/DRESSINGS) ×1 IMPLANT
GLOVE BIO SURGEON STRL SZ7.5 (GLOVE) ×2 IMPLANT
GLOVE BIOGEL PI IND STRL 7.0 (GLOVE) ×2 IMPLANT
GLOVE BIOGEL PI IND STRL 7.5 (GLOVE) ×1 IMPLANT
GLOVE BIOGEL PI INDICATOR 7.0 (GLOVE) ×2
GLOVE BIOGEL PI INDICATOR 7.5 (GLOVE) ×1
GLOVE SURG ORTHO 8.0 STRL STRW (GLOVE) ×2 IMPLANT
GLOVE SURG SS PI 7.0 STRL IVOR (GLOVE) ×2 IMPLANT
GOWN STRL NON-REIN LRG LVL3 (GOWN DISPOSABLE) ×6 IMPLANT
GOWN STRL REIN XL XLG (GOWN DISPOSABLE) ×2 IMPLANT
KIT BASIN OR (CUSTOM PROCEDURE TRAY) ×2 IMPLANT
KIT ROOM TURNOVER OR (KITS) ×2 IMPLANT
NS IRRIG 1000ML POUR BTL (IV SOLUTION) ×2 IMPLANT
PAD ARMBOARD 7.5X6 YLW CONV (MISCELLANEOUS) ×4 IMPLANT
POUCH SPECIMEN RETRIEVAL 10MM (ENDOMECHANICALS) ×2 IMPLANT
SCISSORS LAP 5X35 DISP (ENDOMECHANICALS) ×2 IMPLANT
SET CHOLANGIOGRAPH 5 50 .035 (SET/KITS/TRAYS/PACK) ×2 IMPLANT
SET IRRIG TUBING LAPAROSCOPIC (IRRIGATION / IRRIGATOR) ×2 IMPLANT
SLEEVE ENDOPATH XCEL 5M (ENDOMECHANICALS) ×2 IMPLANT
SPECIMEN JAR SMALL (MISCELLANEOUS) ×2 IMPLANT
SPONGE GAUZE 2X2 STER 10/PKG (GAUZE/BANDAGES/DRESSINGS) ×1
SUT MNCRL AB 4-0 PS2 18 (SUTURE) ×2 IMPLANT
TAPE CLOTH SURG 4X10 WHT LF (GAUZE/BANDAGES/DRESSINGS) ×2 IMPLANT
TOWEL OR 17X24 6PK STRL BLUE (TOWEL DISPOSABLE) ×2 IMPLANT
TOWEL OR 17X26 10 PK STRL BLUE (TOWEL DISPOSABLE) ×2 IMPLANT
TOWEL OR NON WOVEN STRL DISP B (DISPOSABLE) ×2 IMPLANT
TRAY LAPAROSCOPIC (CUSTOM PROCEDURE TRAY) ×2 IMPLANT
TROCAR XCEL BLUNT TIP 100MML (ENDOMECHANICALS) ×2 IMPLANT
TROCAR XCEL NON-BLD 11X100MML (ENDOMECHANICALS) ×2 IMPLANT
TROCAR XCEL NON-BLD 5MMX100MML (ENDOMECHANICALS) ×2 IMPLANT
WATER STERILE IRR 1000ML POUR (IV SOLUTION) IMPLANT

## 2013-03-16 NOTE — Progress Notes (Signed)
[facility]                                                                                Patient Demographics  Vicki Chavez, is a 79 y.o. female, DOB - [birth year], ZOX:[id number]  Admit date - 03/12/2013   Admitting Physician Lynden Oxford, MD  Outpatient Primary MD for the patient is Pcp Not In System  LOS - 4   Chief Complaint  Patient presents with  . Abdominal Pain     Interim history This is an 79 year old with a history of hypertension, hepatitis C that presents emergency department for abdominal pain. Patient was noted to have a possible choledocholithiasis with obstructive jaundice. She underwent ERCP on 03/14/2013 which were large distal stone with diffuse biliary tree dilatation was seen.  Patient was placed on ciprofloxacin. Upper scopic cholecystectomy is planned for today 03/16/2013. GI was also consulted patient did have a history of hepatitis B records were obtained from her previous hospital.  Assessment & Plan   Principal Problem:   Obstructive jaundice Active Problems:   Biliary obstruction   h/o Hepatitis E   Elevated LFTs   Itching  Acute abdominal pain secondary to possible choledocholithiasis -Patient has obstructive jaundice -Gastroenterology consulted -Will continue to monitor her LFTs, trending downward -ERCP 12/7.  Large distal stone with diffuse biliary tree dilatation -Continue ciprofloxacin and Protonix -Surgery consulted for lap chole (planned for today 03/16/13)  History of hepatitis E -Records from June of 2006 show acute hepatitis B with liver biopsy showing acute periportal inflammation, no granulomas, negative trichrome stain consistent with acute hepatitis -Gastroenterology following  Elevated LFTs and biliary obstruction -Trending downward  Hyponatremia -Na 136, improving.   -Continue IVF  Lower extremity numbness -Chronic -May possibly consider Neurontin  Code Status: Full  Family Communication: Daughter at  bedside  Disposition Plan: Admitted.    Procedures  ERCP with sphincteromtomy  Consults   Gastroenterology, Dr. Juanda Chance General surgery  DVT Prophylaxis Heparin  Lab Results  Component Value Date   PLT 327 03/16/2013    Medications  Scheduled Meds: . [facility] HOLD] ciprofloxacin  400 mg Intravenous Q12H  . [MAR HOLD] pantoprazole (PROTONIX) IV  40 mg Intravenous Q24H   Continuous Infusions: . sodium chloride 50 mL/hr at 03/15/13 1212  . sodium chloride    . lactated ringers     PRN Meds:.fentaNYL, [MAR HOLD] hydrALAZINE, [MAR HOLD] ondansetron (ZOFRAN) IV, [MAR HOLD] ondansetron, [MAR HOLD] oxyCODONE, promethazine, [MAR HOLD] traMADol  Antibiotics    Anti-infectives   Start     Dose/Rate Route Frequency Ordered Stop   03/15/13 0600  ciprofloxacin (CIPRO) IVPB 400 mg     400 mg 200 mL/hr over 60 Minutes Intravenous On call to O.R. 03/14/13 1319 03/16/13 0559   03/13/13 1200  [MAR Hold]  ciprofloxacin (CIPRO) IVPB 400 mg     (On MAR Hold since 03/16/13 0911)   400 mg 200 mL/hr over 60 Minutes Intravenous Every 12 hours 03/13/13 0646     03/13/13 0015  ciprofloxacin (CIPRO) IVPB 400 mg     400 mg 200 mL/hr over 60 Minutes Intravenous  Once 03/13/13 0000 03/13/13 0143       Time Spent  in minutes   25 minutes   Vicki Chavez D.O. on 03/16/2013 at 11:16 AM  Between 7am to 7pm - Pager - [phone]  After 7pm go to [email or web address] - password [email or web address]  And look for the night coverage person covering for me after hours  [facility] Office  [phone]    Subjective:   Vicki Chavez seen and examined today.  Patient states her abdominal pain has improve and only occurs when someone is palpating her abdomen. She states the numbness in her feet have also improved. She continues to have some nausea. Patient denies dizziness, chest pain, shortness of breath, new weakness,  tingling.    Objective:   Filed Vitals:   03/15/13 1400 03/15/13 2117 03/16/13 0533  03/16/13 0846  BP: 156/61 154/69 127/71 135/72  Pulse: 94 74 86 79  Temp: 97.9 F (36.6 C) 97.3 F (36.3 C) 98 F (36.7 C) 97.3 F (36.3 C)  TempSrc: Oral Oral Oral Oral  Resp: 18 18 18 18   Height:      Weight:      SpO2: 96% 95% 98% 100%    Wt Readings from Last 3 Encounters:  03/12/13 50.984 kg (112 lb 6.4 oz)  03/12/13 50.984 kg (112 lb 6.4 oz)  03/12/13 50.984 kg (112 lb 6.4 oz)     Intake/Output Summary (Last 24 hours) at 03/16/13 1116 Last data filed at 03/15/13 2239  Gross per 24 hour  Intake   4092 ml  Output      0 ml  Net   4092 ml    Exam  General: Well developed, well nourished, NAD, appears stated age  HEENT: NCAT, icteric sclera,  mucous membranes moist.   Neck: Supple, no JVD, no masses  Cardiovascular: S1 S2 auscultated, no rubs, murmurs or gallops. Regular rate and rhythm.  Respiratory: Clear to auscultation bilaterally with equal chest rise  Abdomen: Soft, mild tenderness to palpation of the right upper quadrant, nondistended, + bowel sounds  Extremities: warm dry without cyanosis clubbing or edema  Neuro: AAOx3, cranial nerves grossly intact.   Skin: Without rashes exudates or nodules  Psych: Normal affect and demeanor with intact judgement and insight  Data Review   Micro Results Recent Results (from the past 240 hour(s))  SURGICAL PCR SCREEN     Status: None   Collection Time    03/15/13  5:44 AM      Result Value Range Status   MRSA, PCR NEGATIVE  NEGATIVE Final   Staphylococcus aureus NEGATIVE  NEGATIVE Final   Comment:            The Xpert SA Assay (FDA     approved for NASAL specimens     in patients over 75 years of age),     is one component of     a comprehensive surveillance     program.  Test performance has     been validated by [employer] for patients greater     than or equal to 79 year old.     It is not intended     to diagnose infection nor to     guide or monitor treatment.    Radiology Reports US  Abdomen Complete  03/12/2013   CLINICAL DATA:  Abdominal pain.  EXAM: ULTRASOUND ABDOMEN COMPLETE  COMPARISON:  None.  FINDINGS: Gallbladder:  Multiple echogenic structures within the gallbladder are compatible with stones. Mild distention of the gallbladder. No significant gallbladder wall thickening. Reportedly, the  patient does not have a sonographic Murphy's sign. Largest gallstone measures 1.2 cm. There are echogenic structures along the gallbladder wall that are suggestive for polyps and the largest measures 0.7 cm.  Common bile duct:  Diameter: Common bile duct is markedly dilated, measuring up to 1.5 cm.  Liver:  No focal lesion identified. Within normal limits in parenchymal echogenicity.  IVC:  No abnormality visualized.  Pancreas:  Visualized portion unremarkable.  Spleen:  Poorly visualized.  Right Kidney:  Length: 9.7 cm. Round hypoechoic structure in the right kidney lower pole measures 2.7 x 1.8 x 1.9 cm. The may be a septation within this structure and findings are suggestive for a cyst.  Left Kidney:  Length: 10.0 cm. There is mild fullness in left renal collecting system.  Abdominal aorta:  No aneurysm visualized.  Other findings:  None.  IMPRESSION: The gallbladder contains multiple echogenic stones. In addition, there appears to be gallbladder polyps.  Marked dilatation of the common bile duct, measuring up to 1.5 cm. Findings raise concern for a distal common bile duct obstruction.  Mild fullness or hydronephrosis in the left kidney of unknown etiology.  The common bile duct and left kidney findings could be further evaluated with CT.  Right renal cyst.  This cyst may be mildly complex as described.   Electronically Signed   By: Richarda Overlie M.D.   On: 03/12/2013 23:35    CBC  Recent Labs Lab 03/12/13 1646 03/13/13 0750 03/16/13 0810  WBC 5.2 5.8 5.6  HGB 12.2 11.9* 12.0  HCT 35.5* 34.6* 35.6*  PLT 323 292 327  MCV 79.2 79.4 80.9  MCH 27.2 27.3 27.3  MCHC 34.4 34.4 33.7  RDW  16.3* 16.2* 16.6*  LYMPHSABS 1.6  --   --   MONOABS 0.7  --   --   EOSABS 0.1  --   --   BASOSABS 0.0  --   --     Chemistries   Recent Labs Lab 03/13/13 0750 03/13/13 0830 03/14/13 0540 03/15/13 0045 03/15/13 0655 03/16/13 0810  NA 137  --  137 135 136 136  K 4.6  --  4.2 4.1 4.3 3.7  CL 101  --  102 103 103 101  CO2 25  --  23 22 26 25   GLUCOSE 99  --  115* 104* 116* 128*  BUN 14  --  13 10 10 11   CREATININE 0.51  --  0.55 0.50 0.65 0.62  CALCIUM 9.0  --  8.9 8.7 9.1 9.3  AST 402* 397* 364* 262* 230*  --   ALT 446* 456* 430* 364* 345*  --   ALKPHOS 631* 652* 638* 568* 581*  --   BILITOT 6.4* 6.7* 5.7* 6.2* 4.4*  --    ------------------------------------------------------------------------------------------------------------------ estimated creatinine clearance is 39.1 ml/min (by C-G formula based on Cr of 0.62). ------------------------------------------------------------------------------------------------------------------ No results found for this basename: HGBA1C,  in the last 72 hours ------------------------------------------------------------------------------------------------------------------ No results found for this basename: CHOL, HDL, LDLCALC, TRIG, CHOLHDL, LDLDIRECT,  in the last 72 hours ------------------------------------------------------------------------------------------------------------------ No results found for this basename: TSH, T4TOTAL, FREET3, T3FREE, THYROIDAB,  in the last 72 hours ------------------------------------------------------------------------------------------------------------------ No results found for this basename: VITAMINB12, FOLATE, FERRITIN, TIBC, IRON, RETICCTPCT,  in the last 72 hours  Coagulation profile  Recent Labs Lab 03/13/13 0750  INR 0.99    No results found for this basename: DDIMER,  in the last 72 hours  Cardiac Enzymes No results found for this basename: CK, CKMB, TROPONINI, MYOGLOBIN,  in the last  168 hours ------------------------------------------------------------------------------------------------------------------ No components found with this basename: POCBNP,

## 2013-03-16 NOTE — Interval H&P Note (Signed)
History and Physical Interval Note:  03/16/2013 9:52 AM  Vicki Chavez  has presented today for surgery, with the diagnosis of GALLSTONES.  The various methods of treatment have been discussed with the patient and family. After consideration of risks, benefits and other options for treatment, the patient has consented to   Procedure(s): LAPAROSCOPIC CHOLECYSTECTOMY WITH INTRAOPERATIVE CHOLANGIOGRAM (N/A) as a surgical intervention .    The patient's history has been reviewed, patient examined, no change in status, stable for surgery.  I have reviewed the patient's chart and labs.  Questions were answered to the patient's satisfaction.    Velora Heckler, MD, [facility] Office: [phone]    Diera Wirkkala Judie Petit

## 2013-03-16 NOTE — Transfer of Care (Signed)
Immediate Anesthesia Transfer of Care Note  Patient: Vicki Chavez  Procedure(s) Performed: Procedure(s): LAPAROSCOPIC CHOLECYSTECTOMY WITH INTRAOPERATIVE CHOLANGIOGRAM (N/A)  Patient Location: PACU  Anesthesia Type:General  Level of Consciousness: awake and oriented  Airway & Oxygen Therapy: Patient Spontanous Breathing and Patient connected to nasal cannula oxygen  Post-op Assessment: Report given to PACU RN and Patient moving all extremities X 4  Post vital signs: Reviewed and stable  Complications: No apparent anesthesia complications

## 2013-03-16 NOTE — Anesthesia Preprocedure Evaluation (Signed)
Anesthesia Evaluation  Patient identified by MRN, date of birth, ID band Patient awake    Reviewed: Allergy & Precautions, H&P , NPO status , Patient's Chart, lab work & pertinent test results  History of Anesthesia Complications Negative for: history of anesthetic complications  Airway Mallampati: I  Neck ROM: Full    Dental  (+) Teeth Intact, Caps and Dental Advisory Given   Pulmonary neg pulmonary ROS,  breath sounds clear to auscultation        Cardiovascular hypertension, Rhythm:Regular Rate:Normal     Neuro/Psych negative neurological ROS     GI/Hepatic (+) Hepatitis -  Endo/Other    Renal/GU      Musculoskeletal   Abdominal   Peds  Hematology   Anesthesia Other Findings   Reproductive/Obstetrics                           Anesthesia Physical Anesthesia Plan  ASA: II  Anesthesia Plan: General   Post-op Pain Management:    Induction: Intravenous  Airway Management Planned: Oral ETT  Additional Equipment:   Intra-op Plan:   Post-operative Plan: Extubation in OR  Informed Consent: I have reviewed the patients History and Physical, chart, labs and discussed the procedure including the risks, benefits and alternatives for the proposed anesthesia with the patient or authorized representative who has indicated his/her understanding and acceptance.   Dental advisory given  Plan Discussed with: CRNA and Surgeon  Anesthesia Plan Comments:         Anesthesia Quick Evaluation

## 2013-03-16 NOTE — Anesthesia Procedure Notes (Signed)
Procedure Name: Intubation Date/Time: 03/16/2013 10:07 AM Performed by: Jefm Miles E Pre-anesthesia Checklist: Patient identified, Emergency Drugs available, Suction available, Patient being monitored and Timeout performed Patient Re-evaluated:Patient Re-evaluated prior to inductionOxygen Delivery Method: Circle system utilized Preoxygenation: Pre-oxygenation with 100% oxygen Intubation Type: IV induction Ventilation: Mask ventilation without difficulty Laryngoscope Size: Mac and 3 Grade View: Grade III Tube type: Oral Tube size: 7.0 mm Number of attempts: 2 Airway Equipment and Method: Stylet Placement Confirmation: ETT inserted through vocal cords under direct vision Secured at: 21 cm Tube secured with: Tape Dental Injury: Teeth and Oropharynx as per pre-operative assessment  Difficulty Due To: Difficulty was unanticipated and Difficult Airway- due to anterior larynx Future Recommendations: Recommend- induction with short-acting agent, and alternative techniques readily available Comments: Grade 3 view, unable to pass ETT, CRNA attempt X1, Dr Cora Collum inserted ETT with tight curve in ETT and by feel attempt x . Positive ETCO2 and chest rise. Recommend glidescope use for future intubations.

## 2013-03-16 NOTE — Preoperative (Signed)
Beta Blockers   Reason not to administer Beta Blockers:Not Applicable 

## 2013-03-16 NOTE — Progress Notes (Signed)
Pt sleeping. comfortable

## 2013-03-16 NOTE — H&P (View-Only) (Signed)
Reason for Consult:gallstones/ CBD stones  Referring Physician: Dr John Hayes MD   Vicki Chavez is an 79 y.o. female.  HPI: Asked to see pt at request of Dr Hayes for CBD stone and cholelithiasis.  Admitted 12/5 for jaundice and epigastric discomfort.  Underwent ERCP today for CBD stone.  She is a liitle sore in upper abdomen. FAMILY AT BEDSIDE.  Denies severe abdominal pain  at this point.    Past Medical History  Diagnosis Date  . Hypertension   . High cholesterol   . Osteoporosis   . Prediabetes   . Gall stones     History reviewed. No pertinent past surgical history.  History reviewed. No pertinent family history.  Social History:  reports that she has never smoked. She does not have any smokeless tobacco history on file. She reports that she does not drink alcohol or use illicit drugs.  Allergies:  Allergies  Allergen Reactions  . Penicillins     Does not remember    Medications: I have reviewed the patient's current medications.  Results for orders placed during the hospital encounter of 03/12/13 (from the past 48 hour(s))  URINALYSIS, ROUTINE W REFLEX MICROSCOPIC     Status: Abnormal   Collection Time    03/12/13  4:43 PM      Result Value Range   Color, Urine YELLOW  YELLOW   APPearance CLEAR  CLEAR   Specific Gravity, Urine 1.007  1.005 - 1.030   pH 5.5  5.0 - 8.0   Glucose, UA NEGATIVE  NEGATIVE mg/dL   Hgb urine dipstick NEGATIVE  NEGATIVE   Bilirubin Urine SMALL (*) NEGATIVE   Ketones, ur NEGATIVE  NEGATIVE mg/dL   Protein, ur NEGATIVE  NEGATIVE mg/dL   Urobilinogen, UA 1.0  0.0 - 1.0 mg/dL   Nitrite NEGATIVE  NEGATIVE   Leukocytes, UA NEGATIVE  NEGATIVE   Comment: MICROSCOPIC NOT DONE ON URINES WITH NEGATIVE PROTEIN, BLOOD, LEUKOCYTES, NITRITE, OR GLUCOSE <1000 mg/dL.  CBC WITH DIFFERENTIAL     Status: Abnormal   Collection Time    03/12/13  4:46 PM      Result Value Range   WBC 5.2  4.0 - 10.5 K/uL   RBC 4.48  3.87 - 5.11 MIL/uL   Hemoglobin 12.2   12.0 - 15.0 g/dL   HCT 35.5 (*) 36.0 - 46.0 %   MCV 79.2  78.0 - 100.0 fL   MCH 27.2  26.0 - 34.0 pg   MCHC 34.4  30.0 - 36.0 g/dL   RDW 16.3 (*) 11.5 - 15.5 %   Platelets 323  150 - 400 K/uL   Neutrophils Relative % 55  43 - 77 %   Neutro Abs 2.9  1.7 - 7.7 K/uL   Lymphocytes Relative 30  12 - 46 %   Lymphs Abs 1.6  0.7 - 4.0 K/uL   Monocytes Relative 12  3 - 12 %   Monocytes Absolute 0.7  0.1 - 1.0 K/uL   Eosinophils Relative 2  0 - 5 %   Eosinophils Absolute 0.1  0.0 - 0.7 K/uL   Basophils Relative 1  0 - 1 %   Basophils Absolute 0.0  0.0 - 0.1 K/uL  COMPREHENSIVE METABOLIC PANEL     Status: Abnormal   Collection Time    03/12/13  4:46 PM      Result Value Range   Sodium 134 (*) 135 - 145 mEq/L   Potassium 4.0  3.5 - 5.1 mEq/L     Chloride 98  96 - 112 mEq/L   CO2 25  19 - 32 mEq/L   Glucose, Bld 113 (*) 70 - 99 mg/dL   BUN 20  6 - 23 mg/dL   Creatinine, Ser 0.56  0.50 - 1.10 mg/dL   Calcium 9.4  8.4 - 10.5 mg/dL   Total Protein 7.9  6.0 - 8.3 g/dL   Albumin 3.4 (*) 3.5 - 5.2 g/dL   AST 449 (*) 0 - 37 U/L   ALT 508 (*) 0 - 35 U/L   Alkaline Phosphatase 723 (*) 39 - 117 U/L   Total Bilirubin 6.4 (*) 0.3 - 1.2 mg/dL   GFR calc non Af Amer 85 (*) >90 mL/min   GFR calc Af Amer >90  >90 mL/min   Comment: (NOTE)     The eGFR has been calculated using the CKD EPI equation.     This calculation has not been validated in all clinical situations.     eGFR's persistently <90 mL/min signify possible Chronic Kidney     Disease.  LIPASE, BLOOD     Status: Abnormal   Collection Time    03/12/13  4:46 PM      Result Value Range   Lipase 86 (*) 11 - 59 U/L  COMPREHENSIVE METABOLIC PANEL     Status: Abnormal   Collection Time    03/13/13  7:50 AM      Result Value Range   Sodium 137  135 - 145 mEq/L   Potassium 4.6  3.5 - 5.1 mEq/L   Comment: HEMOLYSIS AT THIS LEVEL MAY AFFECT RESULT   Chloride 101  96 - 112 mEq/L   CO2 25  19 - 32 mEq/L   Glucose, Bld 99  70 - 99 mg/dL   BUN  14  6 - 23 mg/dL   Creatinine, Ser 0.51  0.50 - 1.10 mg/dL   Calcium 9.0  8.4 - 10.5 mg/dL   Total Protein 7.2  6.0 - 8.3 g/dL   Albumin 3.1 (*) 3.5 - 5.2 g/dL   AST 402 (*) 0 - 37 U/L   Comment: HEMOLYSIS AT THIS LEVEL MAY AFFECT RESULT   ALT 446 (*) 0 - 35 U/L   Comment: HEMOLYSIS AT THIS LEVEL MAY AFFECT RESULT   Alkaline Phosphatase 631 (*) 39 - 117 U/L   Total Bilirubin 6.4 (*) 0.3 - 1.2 mg/dL   GFR calc non Af Amer 88 (*) >90 mL/min   GFR calc Af Amer >90  >90 mL/min   Comment: (NOTE)     The eGFR has been calculated using the CKD EPI equation.     This calculation has not been validated in all clinical situations.     eGFR's persistently <90 mL/min signify possible Chronic Kidney     Disease.  CBC     Status: Abnormal   Collection Time    03/13/13  7:50 AM      Result Value Range   WBC 5.8  4.0 - 10.5 K/uL   RBC 4.36  3.87 - 5.11 MIL/uL   Hemoglobin 11.9 (*) 12.0 - 15.0 g/dL   HCT 34.6 (*) 36.0 - 46.0 %   MCV 79.4  78.0 - 100.0 fL   MCH 27.3  26.0 - 34.0 pg   MCHC 34.4  30.0 - 36.0 g/dL   RDW 16.2 (*) 11.5 - 15.5 %   Platelets 292  150 - 400 K/uL  PROTIME-INR     Status: None   Collection Time      03/13/13  7:50 AM      Result Value Range   Prothrombin Time 12.9  11.6 - 15.2 seconds   INR 0.99  0.00 - 1.49  HEPATIC FUNCTION PANEL     Status: Abnormal   Collection Time    03/13/13  8:30 AM      Result Value Range   Total Protein 7.1  6.0 - 8.3 g/dL   Albumin 3.1 (*) 3.5 - 5.2 g/dL   AST 397 (*) 0 - 37 U/L   ALT 456 (*) 0 - 35 U/L   Alkaline Phosphatase 652 (*) 39 - 117 U/L   Total Bilirubin 6.7 (*) 0.3 - 1.2 mg/dL   Bilirubin, Direct 5.0 (*) 0.0 - 0.3 mg/dL   Indirect Bilirubin 1.7 (*) 0.3 - 0.9 mg/dL  AMYLASE     Status: None   Collection Time    03/13/13  8:30 AM      Result Value Range   Amylase 101  0 - 105 U/L  LIPASE, BLOOD     Status: None   Collection Time    03/13/13  8:30 AM      Result Value Range   Lipase 51  11 - 59 U/L  HEPATITIS PANEL,  ACUTE     Status: None   Collection Time    03/13/13  8:30 AM      Result Value Range   Hepatitis B Surface Ag NEGATIVE  NEGATIVE   HCV Ab NEGATIVE  NEGATIVE   Hep A IgM NON REACTIVE  NON REACTIVE   Hep B C IgM NON REACTIVE  NON REACTIVE   Comment: (NOTE)     High levels of Hepatitis B Core IgM antibody are detectable     during the acute stage of Hepatitis B. This antibody is used     to differentiate current from past HBV infection.     Performed at [facility]  COMPREHENSIVE METABOLIC PANEL     Status: Abnormal   Collection Time    03/14/13  5:40 AM      Result Value Range   Sodium 137  135 - 145 mEq/L   Potassium 4.2  3.5 - 5.1 mEq/L   Chloride 102  96 - 112 mEq/L   CO2 23  19 - 32 mEq/L   Glucose, Bld 115 (*) 70 - 99 mg/dL   BUN 13  6 - 23 mg/dL   Creatinine, Ser 0.55  0.50 - 1.10 mg/dL   Calcium 8.9  8.4 - 10.5 mg/dL   Total Protein 6.7  6.0 - 8.3 g/dL   Albumin 2.9 (*) 3.5 - 5.2 g/dL   AST 364 (*) 0 - 37 U/L   ALT 430 (*) 0 - 35 U/L   Alkaline Phosphatase 638 (*) 39 - 117 U/L   Total Bilirubin 5.7 (*) 0.3 - 1.2 mg/dL   GFR calc non Af Amer 86 (*) >90 mL/min   GFR calc Af Amer >90  >90 mL/min   Comment: (NOTE)     The eGFR has been calculated using the CKD EPI equation.     This calculation has not been validated in all clinical situations.     eGFR's persistently <90 mL/min signify possible Chronic Kidney     Disease.    Us Abdomen Complete  03/12/2013   CLINICAL DATA:  Abdominal pain.  EXAM: ULTRASOUND ABDOMEN COMPLETE  COMPARISON:  None.  FINDINGS: Gallbladder:  Multiple echogenic structures within the gallbladder are compatible with stones. Mild distention of   the gallbladder. No significant gallbladder wall thickening. Reportedly, the patient does not have a sonographic Murphy's sign. Largest gallstone measures 1.2 cm. There are echogenic structures along the gallbladder wall that are suggestive for polyps and the largest measures 0.7 cm.  Common bile duct:   Diameter: Common bile duct is markedly dilated, measuring up to 1.5 cm.  Liver:  No focal lesion identified. Within normal limits in parenchymal echogenicity.  IVC:  No abnormality visualized.  Pancreas:  Visualized portion unremarkable.  Spleen:  Poorly visualized.  Right Kidney:  Length: 9.7 cm. Round hypoechoic structure in the right kidney lower pole measures 2.7 x 1.8 x 1.9 cm. The may be a septation within this structure and findings are suggestive for a cyst.  Left Kidney:  Length: 10.0 cm. There is mild fullness in left renal collecting system.  Abdominal aorta:  No aneurysm visualized.  Other findings:  None.  IMPRESSION: The gallbladder contains multiple echogenic stones. In addition, there appears to be gallbladder polyps.  Marked dilatation of the common bile duct, measuring up to 1.5 cm. Findings raise concern for a distal common bile duct obstruction.  Mild fullness or hydronephrosis in the left kidney of unknown etiology.  The common bile duct and left kidney findings could be further evaluated with CT.  Right renal cyst.  This cyst may be mildly complex as described.   Electronically Signed   By: Adam  Henn M.D.   On: 03/12/2013 23:35    Review of Systems  Constitutional: Negative.   Eyes: Negative.   Respiratory: Negative.   Cardiovascular: Negative.   Gastrointestinal: Positive for abdominal pain.  Genitourinary: Negative.   Musculoskeletal: Negative.   Skin: Positive for itching.  Neurological: Negative.   Endo/Heme/Allergies: Negative.   Psychiatric/Behavioral: Negative.    Blood pressure 156/59, pulse 74, temperature 98 F (36.7 C), temperature source Oral, resp. rate 18, height 4' 10" (1.473 m), weight 112 lb 6.4 oz (50.984 kg), SpO2 100.00%. Physical Exam  Constitutional: She is oriented to person, place, and time. She appears well-developed.  HENT:  Head: Normocephalic and atraumatic.  Eyes: Scleral icterus is present.  Cardiovascular: Normal rate.   Respiratory:  Effort normal.  GI: She exhibits no distension. There is tenderness.  Musculoskeletal: Normal range of motion.  Neurological: She is alert and oriented to person, place, and time.  Skin: Skin is warm and dry.  Psychiatric: She has a normal mood and affect. Her behavior is normal. Judgment and thought content normal.    Assessment/Plan: choledocolithiasis s/p ERCP   Gallstones  Recommend Lap chole IOC to pt and family during this admission.  Offered surgery today since she is NPO but family wished to wait until am since some concern for post op pancreatitis.   Will check labs in AM and post for Monday for DOW MD.    Naasia Weilbacher A. 03/14/2013, 1:04 PM      

## 2013-03-16 NOTE — Progress Notes (Signed)
ANTIBIOTIC CONSULT NOTE - FOLLOW UP  Pharmacy Consult for ciprofloxacin Indication: cholecystitis  Allergies  Allergen Reactions  . Penicillins     Does not remember    Patient Measurements: Height: 4\' 10"  (147.3 cm) Weight: 112 lb 6.4 oz (50.984 kg) IBW/kg (Calculated) : 40.9   Vital Signs: Temp: 97.6 F (36.4 C) (12/09 1245) Temp src: Oral (12/09 0846) BP: 136/48 mmHg (12/09 1245) Pulse Rate: 61 (12/09 1245) Intake/Output from previous day: 12/08 0701 - 12/09 0700 In: 4432 [P.O.:340; I.V.:4092] Out: 590 [Urine:590] Intake/Output from this shift: Total I/O In: 500 [I.V.:500] Out: 300 [Urine:300]  Labs:  Recent Labs  03/15/13 0045 03/15/13 0655 03/16/13 0810  WBC  --   --  5.6  HGB  --   --  12.0  PLT  --   --  327  CREATININE 0.50 0.65 0.62   Estimated Creatinine Clearance: 39.1 ml/min (by C-G formula based on Cr of 0.62).   Microbiology: Recent Results (from the past 720 hour(s))  SURGICAL PCR SCREEN     Status: None   Collection Time    03/15/13  5:44 AM      Result Value Range Status   MRSA, PCR NEGATIVE  NEGATIVE Final   Staphylococcus aureus NEGATIVE  NEGATIVE Final   Comment:            The Xpert SA Assay (FDA     approved for NASAL specimens     in patients over 64 years of age),     is one component of     a comprehensive surveillance     program.  Test performance has     been validated by [employer] for patients greater     than or equal to 13 year old.     It is not intended     to diagnose infection nor to     guide or monitor treatment.    Anti-infectives   Start     Dose/Rate Route Frequency Ordered Stop   03/15/13 0600  ciprofloxacin (CIPRO) IVPB 400 mg  Status:  Discontinued     400 mg 200 mL/hr over 60 Minutes Intravenous On call to O.R. 03/14/13 1319 03/16/13 1322   03/13/13 1200  ciprofloxacin (CIPRO) IVPB 400 mg     400 mg 200 mL/hr over 60 Minutes Intravenous Every 12 hours 03/13/13 0646     03/13/13 0015   ciprofloxacin (CIPRO) IVPB 400 mg     400 mg 200 mL/hr over 60 Minutes Intravenous  Once 03/13/13 0000 03/13/13 0143      Assessment: 79 YOF now s/p lap chole today. She continues on ciprofloxacin after coming in with RUQ x1 week and found to have cholecystitis. SCr 0.62, CrCl ~58mL/min. WBC 5.6 and patient is now afebrile.  Goal of Therapy:  Eradication of infection  Plan:  1. Continue ciprofloxacin 400mg  IV q12h 2. Follow up LOT, fever curve, clinical progression  Arian Mcquitty D. Lorriane Dehart, PharmD, BCPS Clinical Pharmacist Pager: [phone] 03/16/2013 2:24 PM

## 2013-03-16 NOTE — Op Note (Signed)
Procedure Note  Pre-operative Diagnosis:  Cholelithiasis, chronic cholecystitis  Post-operative Diagnosis:  same  Surgeon:  Velora Heckler, MD, FACS  Assistant:  none   Procedure:  Laparoscopic cholecystectomy with intra-operative cholangiography  Anesthesia:  General  Estimated Blood Loss:  minimal  Drains: none         Specimen: Gallbladder to pathology  Indications:  Patient underwent ERCP with sphincterotomy.  USN shows cholelithiasis.  Now for cholecystectomy.  Procedure Details:  The patient was seen in the pre-op holding area. The risks, benefits, complications, treatment options, and expected outcomes have been discussed with the patient. The patient agreed with the proposed plan and signed the informed consent form.  The patient was taken to Operating Room, identified as Vicki Chavez and the procedure verified as Laparoscopic Cholecystectomy with Intraoperative Cholangiogram. A "time out" was completed and the above information confirmed.  Following induction of general anesthesia, the patient was placed in the supine position. The abdomen was prepped and draped in the usual aseptic fashion.  An incision was made in the skin below the umbilicus. The midline fascia was incised and the peritoneal cavity entered and the Hasson canula was introduced under direct vision.  The Hasson canula was secured with a 0-Vicryl pursestring suture. Pneumoperitoneum was established with carbon dioxide. Additional trocars were introduced under direct vision along the right costal margin in the midline, mid-clavicular line, and anterior axillary line.   The gallbladder was identified and the fundus grasped and retracted cephalad. Adhesions were taken down bluntly and the electrocautery was utilized as needed, taking care not to injure any adjacent structures. The infundibulum was grasped and retracted laterally, exposing the peritoneum overlying the triangle of Calot. The peritoneum was incised  and structures exposed with blunt dissection. The cystic duct was clearly identified, bluntly dissected circumferentially, and clipped at the neck of the gallbladder.  An incision was made in the cystic duct and the cholangiogram catheter introduced. The catheter was secured using an ligaclip.  Real-time cholangiography was performed using C-arm fluoroscopy.  There was rapid filling of a normal caliber common bile duct.  There was reflux of contrast into the left and right hepatic ductal systems.  There was free flow distally into the duodenum without filling defect or obstruction.  Catheter was removed from the peritoneal cavity.  The cystic duct was then triply ligated with surgical clips and divided. The cystic artery was identified, dissected circumferentially, ligated with ligaclips, and divided.  The gallbladder was dissected away from the liver bed using the electrocautery for hemostasis. The gallbladder was completely removed from the liver and placed into an endocatch bag. The right upper quadrant was irrigated and the gallbladder bed was inspected. Hemostasis was achieved with the electrocautery. Warm saline irrigation was utilized until clear.  Pneumoperitoneum was released after viewing removal of the trocars with good hemostasis noted. The umbilical wound was irrigated and the fascia was then closed with the pursestring suture.  Local anesthetic was infiltrated at all port sites. The skin incisions were closed with 4-0 Monocril subcuticular sutures and steri-strips and dressings were applied.  Instrument, sponge, and needle counts were correct at the conclusion of the case.  The patient was awakened from anesthesia and brought to the recovery room in stable condition.  The patient tolerated the procedure well.   Velora Heckler, MD, [facility] Office: [phone]

## 2013-03-17 ENCOUNTER — Telehealth (INDEPENDENT_AMBULATORY_CARE_PROVIDER_SITE_OTHER): Payer: Self-pay

## 2013-03-17 DIAGNOSIS — K8020 Calculus of gallbladder without cholecystitis without obstruction: Secondary | ICD-10-CM

## 2013-03-17 LAB — BASIC METABOLIC PANEL
Calcium: 8.5 mg/dL (ref 8.4–10.5)
GFR calc Af Amer: >90 mL/min (ref >90)
GFR calc non Af Amer: 83 mL/min — ABNORMAL LOW (ref >90)
Potassium: 3.7 mEq/L (ref 3.5–5.1)
Sodium: 134 mEq/L — ABNORMAL LOW (ref 135–145)

## 2013-03-17 LAB — COMPREHENSIVE METABOLIC PANEL
ALT: 356 U/L — ABNORMAL HIGH (ref 0–35)
AST: 281 U/L — ABNORMAL HIGH (ref 0–37)
CO2: 27 mEq/L (ref 19–32)
Chloride: 104 mEq/L (ref 96–112)
Creatinine, Ser: 0.62 mg/dL (ref 0.50–1.10)
GFR calc Af Amer: >90 mL/min (ref >90)
GFR calc non Af Amer: 82 mL/min — ABNORMAL LOW (ref >90)
Glucose, Bld: 135 mg/dL — ABNORMAL HIGH (ref 70–99)
Sodium: 136 mEq/L (ref 135–145)
Total Bilirubin: 2.5 mg/dL — ABNORMAL HIGH (ref 0.3–1.2)

## 2013-03-17 LAB — CBC
MCH: 26.9 pg (ref 26.0–34.0)
MCHC: 32.6 g/dL (ref 30.0–36.0)
Platelets: 265 10*3/uL (ref 150–400)
RBC: 3.72 MIL/uL — ABNORMAL LOW (ref 3.87–5.11)
RDW: 16.2 % — ABNORMAL HIGH (ref 11.5–15.5)

## 2013-03-17 LAB — GLUCOSE, CAPILLARY
Glucose-Capillary: 138 mg/dL — ABNORMAL HIGH (ref 70–99)
Glucose-Capillary: 174 mg/dL — ABNORMAL HIGH (ref 70–99)

## 2013-03-17 MED ORDER — BISACODYL 10 MG RE SUPP
10.0000 mg | Freq: Once | RECTAL | Status: AC
  Administered 2013-03-17: 10 mg via RECTAL
  Filled 2013-03-17: qty 1

## 2013-03-17 MED ORDER — OXYCODONE HCL 5 MG PO TABS: 5.0000 mg | ORAL_TABLET | ORAL | Status: DC | PRN

## 2013-03-17 MED ORDER — ENOXAPARIN SODIUM 40 MG/0.4ML ~~LOC~~ SOLN
40.0000 mg | SUBCUTANEOUS | Status: DC
  Administered 2013-03-17 – 2013-03-18 (×2): 40 mg via SUBCUTANEOUS
  Filled 2013-03-17 (×2): qty 0.4

## 2013-03-17 MED ORDER — OXYCODONE HCL 5 MG PO TABS
5.0000 mg | ORAL_TABLET | ORAL | Status: DC | PRN
  Administered 2013-03-18: 5 mg via ORAL
  Filled 2013-03-17: qty 1

## 2013-03-17 MED ORDER — PROMETHAZINE HCL 25 MG/ML IJ SOLN
12.5000 mg | Freq: Four times a day (QID) | INTRAMUSCULAR | Status: DC | PRN
  Filled 2013-03-17: qty 1

## 2013-03-17 MED ORDER — IBUPROFEN 400 MG PO TABS
400.0000 mg | ORAL_TABLET | ORAL | Status: DC | PRN
  Administered 2013-03-17 – 2013-03-18 (×3): 400 mg via ORAL
  Filled 2013-03-17 (×4): qty 1

## 2013-03-17 NOTE — Progress Notes (Signed)
General Surgery [facility]  Patient seen and examined.  Daughter at bedside.  Patient complains of persistent nausea and emesis despite Zofran.  Will try Phenergan at half dosage.  Patient concerned about constipation.  Will give suppository this afternoon.  Daughter concerned about acetaminophen in pain Rx.  Will change to hydrocodone.  Encouraged OOB.  Will check CMET in AM 12/11.  Velora Heckler, MD, [facility] Office: [phone]

## 2013-03-17 NOTE — Progress Notes (Signed)
1 Day Post-Op  Subjective: Pt c/o headache, tingling feet, pain in abdomen, and feeling weak.  She has not been OOB yet.  She's lying flat in bed.  Tolerated some clear liquids, wants to advance to vegetarian diet.  Daughter at bedside with very appropriate questions.  Objective: Vital signs in last 24 hours: Temp:  [97.5 F (36.4 C)-98.5 F (36.9 C)] 98 F (36.7 C) (12/10 0453) Pulse Rate:  [61-85] 85 (12/10 0453) Resp:  [12-19] 18 (12/10 0453) BP: (117-152)/(46-71) 117/46 mmHg (12/10 0453) SpO2:  [97 %-100 %] 100 % (12/10 0453) Last BM Date: 03/14/13  Intake/Output from previous day: 12/09 0701 - 12/10 0700 In: 549.2 [I.V.:549.2] Out: 300 [Urine:300] Intake/Output this shift:    PE: Gen:  Alert, NAD, pleasant Abd: Soft, ND, mild tenderness in epigastrium and RUQ, +BS, no HSM, incisions C/D/I   Lab Results:   Recent Labs  03/16/13 0810 03/17/13 0605  WBC 5.6 6.6  HGB 12.0 10.0*  HCT 35.6* 30.7*  PLT 327 265   BMET  Recent Labs  03/17/13 0003 03/17/13 0605  NA 136 134*  K 4.1 3.7  CL 104 100  CO2 27 26  GLUCOSE 135* 132*  BUN 10 11  CREATININE 0.62 0.61  CALCIUM 8.7 8.5   PT/INR No results found for this basename: LABPROT, INR,  in the last 72 hours CMP     Component Value Date/Time   NA 134* 03/17/2013 0605   K 3.7 03/17/2013 0605   CL 100 03/17/2013 0605   CO2 26 03/17/2013 0605   GLUCOSE 132* 03/17/2013 0605   BUN 11 03/17/2013 0605   CREATININE 0.61 03/17/2013 0605   CALCIUM 8.5 03/17/2013 0605   PROT 6.2 03/17/2013 0003   ALBUMIN 2.8* 03/17/2013 0003   AST 281* 03/17/2013 0003   ALT 356* 03/17/2013 0003   ALKPHOS 466* 03/17/2013 0003   BILITOT 2.5* 03/17/2013 0003   GFRNONAA 83* 03/17/2013 0605   GFRAA >90 03/17/2013 0605   Lipase     Component Value Date/Time   LIPASE 41 03/16/2013 0555       Studies/Results: Dg Cholangiogram Operative  03/16/2013   CLINICAL DATA:  History of cholelithiasis and choledocholithiasis.  EXAM:  INTRAOPERATIVE CHOLANGIOGRAM  TECHNIQUE: Cholangiographic images from the C-arm fluoroscopic device were submitted for interpretation post-operatively. Please see the procedural report for the amount of contrast and the fluoroscopy time utilized.  COMPARISON:  ERCP 03/14/2013.  FINDINGS: There is dilatation of the intrahepatic and extrahepatic bile ducts with dilatation of the common bile duct. No definite filling defects are seen to suggest choledocholithiasis. There is free flow of contrast into the duodenum without evidence of common bile duct obstruction.  IMPRESSION: Dilatation of bile ducts. Dilatation of common bile duct. No definite filling defects are seen to suggest choledocholithiasis. There is free flow of contrast into the duodenum without evidence of common bile duct obstruction.   Electronically Signed   By: Onalee Hua  Call M.D.   On: 03/16/2013 14:32    Anti-infectives: Anti-infectives   Start     Dose/Rate Route Frequency Ordered Stop   03/15/13 0600  ciprofloxacin (CIPRO) IVPB 400 mg  Status:  Discontinued     400 mg 200 mL/hr over 60 Minutes Intravenous On call to O.R. 03/14/13 1319 03/16/13 1322   03/13/13 1200  ciprofloxacin (CIPRO) IVPB 400 mg     400 mg 200 mL/hr over 60 Minutes Intravenous Every 12 hours 03/13/13 0646     03/13/13 0015  ciprofloxacin (CIPRO) IVPB 400  mg     400 mg 200 mL/hr over 60 Minutes Intravenous  Once 03/13/13 0000 03/13/13 0143       Assessment/Plan Chronic choledocholithiasis, cholelithiasis, and acute cholecystitis s/p ERCP and POD #1 lap chole with IOC Hyperbilirubinemia - improved to 2.5  Plan: 1. Advance to vegetarian diet 2. KVO IVF 3. Ambulate and IS 4. SCD's and lovenox 5. Possibly home today if feeling better this afternoon, if not tomorrow AM 6. Recheck LFT's tomorrow if still here, but bili down, not unexpected for LFT's to be up 7. Switch from Norco to Oxy IR, avoid tylenol with LFT's up 8. F/u with Dr. Gerrit Friends in 2-3 weeks  (family's requesting to see him only) 9. No need to continue antibiotics at discharge 10. Would recommend recheck of LFT's in 1 week    LOS: 5 days    DORT, Shonita Rinck 03/17/2013, 9:38 AM Pager: [phone]

## 2013-03-17 NOTE — Progress Notes (Signed)
[facility] PROGRESS NOTE  Vicki Chavez UJW:[id number] DOB: [birth year] DOA: 03/12/2013 PCP: Pcp Not In System  Assessment/Plan: Acute abdominal pain secondary to possible choledocholithiasis  -Patient has obstructive jaundice  -Gastroenterology was consulted  -Will continue to monitor her LFTs, trending downward  -ERCP 12/7. Large distal stone with diffuse biliary tree dilatation  -Continued on ciprofloxacin and Protonix  -Surgery consulted for lap chole, s/p surgery on 03/16/13 History of hepatitis E  -Records from June of 2006 show acute hepatitis B with liver biopsy showing acute periportal inflammation, no granulomas, negative trichrome stain consistent with acute hepatitis  -Gastroenterology following  Elevated LFTs and biliary obstruction  -Overall stable, slightly elevated today s/p surgery Hyponatremia  -Na stable -Continue IVF  Lower extremity numbness  -Chronic  -May possibly consider Neurontin  Code Status: Full Family Communication: Pt in room (indicate person spoken with, relationship, and if by phone, the number) Disposition Plan: Pending   Consultants: - GI - General surgery  Procedures:  Lap chole on 03/16/13  ERCP w/ sphincterotomy  Antibiotics: Ciprofloxacin 03/13/13>>>  HPI/Subjective: No acute events noted overnight  Objective: Filed Vitals:   03/16/13 1653 03/16/13 1842 03/16/13 2141 03/17/13 0453  BP: 152/62 147/64 128/58 117/46  Pulse: 84 74 77 85  Temp: 98.2 F (36.8 C) 98.5 F (36.9 C) 97.7 F (36.5 C) 98 F (36.7 C)  TempSrc: Oral Oral Oral Oral  Resp: 18 19 18 18   Height:      Weight:      SpO2: 98% 100% 97% 100%    Intake/Output Summary (Last 24 hours) at 03/17/13 0935 Last data filed at 03/16/13 1600  Gross per 24 hour  Intake 549.17 ml  Output    300 ml  Net 249.17 ml   Filed Weights   03/12/13 1628  Weight: 50.984 kg (112 lb 6.4 oz)    Exam:   General:  Awake, in nad  Cardiovascular: regular, s1,  s2  Respiratory: normal resp effort, no wheezing  Abdomen: soft, nondistended  Musculoskeletal: perfused, no clubbing   Data Reviewed: Basic Metabolic Panel:  Recent Labs Lab 03/15/13 0045 03/15/13 0655 03/16/13 0810 03/17/13 0003 03/17/13 0605  NA 135 136 136 136 134*  K 4.1 4.3 3.7 4.1 3.7  CL 103 103 101 104 100  CO2 22 26 25 27 26   GLUCOSE 104* 116* 128* 135* 132*  BUN 10 10 11 10 11   CREATININE 0.50 0.65 0.62 0.62 0.61  CALCIUM 8.7 9.1 9.3 8.7 8.5   Liver Function Tests:  Recent Labs Lab 03/13/13 0830 03/14/13 0540 03/15/13 0045 03/15/13 0655 03/17/13 0003  AST 397* 364* 262* 230* 281*  ALT 456* 430* 364* 345* 356*  ALKPHOS 652* 638* 568* 581* 466*  BILITOT 6.7* 5.7* 6.2* 4.4* 2.5*  PROT 7.1 6.7 6.4 6.8 6.2  ALBUMIN 3.1* 2.9* 2.8* 3.0* 2.8*    Recent Labs Lab 03/12/13 1646 03/13/13 0830 03/15/13 0045 03/16/13 0555  LIPASE 86* 51 39 41  AMYLASE  --  101  --   --    No results found for this basename: AMMONIA,  in the last 168 hours CBC:  Recent Labs Lab 03/12/13 1646 03/13/13 0750 03/16/13 0810 03/17/13 0605  WBC 5.2 5.8 5.6 6.6  NEUTROABS 2.9  --   --   --   HGB 12.2 11.9* 12.0 10.0*  HCT 35.5* 34.6* 35.6* 30.7*  MCV 79.2 79.4 80.9 82.5  PLT 323 292 327 265   Cardiac Enzymes: No results found for this basename: CKTOTAL, CKMB,  CKMBINDEX, TROPONINI,  in the last 168 hours BNP (last 3 results) No results found for this basename: PROBNP,  in the last 8760 hours CBG:  Recent Labs Lab 03/14/13 1305 03/15/13 0715 03/16/13 0532 03/17/13 0734  GLUCAP 111* 123* 109* 138*    Recent Results (from the past 240 hour(s))  SURGICAL PCR SCREEN     Status: None   Collection Time    03/15/13  5:44 AM      Result Value Range Status   MRSA, PCR NEGATIVE  NEGATIVE Final   Staphylococcus aureus NEGATIVE  NEGATIVE Final   Comment:            The Xpert SA Assay (FDA     approved for NASAL specimens     in patients over 69 years of age),      is one component of     a comprehensive surveillance     program.  Test performance has     been validated by [employer] for patients greater     than or equal to 43 year old.     It is not intended     to diagnose infection nor to     guide or monitor treatment.     Studies: Dg Cholangiogram Operative  03/16/2013   CLINICAL DATA:  History of cholelithiasis and choledocholithiasis.  EXAM: INTRAOPERATIVE CHOLANGIOGRAM  TECHNIQUE: Cholangiographic images from the C-arm fluoroscopic device were submitted for interpretation post-operatively. Please see the procedural report for the amount of contrast and the fluoroscopy time utilized.  COMPARISON:  ERCP 03/14/2013.  FINDINGS: There is dilatation of the intrahepatic and extrahepatic bile ducts with dilatation of the common bile duct. No definite filling defects are seen to suggest choledocholithiasis. There is free flow of contrast into the duodenum without evidence of common bile duct obstruction.  IMPRESSION: Dilatation of bile ducts. Dilatation of common bile duct. No definite filling defects are seen to suggest choledocholithiasis. There is free flow of contrast into the duodenum without evidence of common bile duct obstruction.   Electronically Signed   By: Onalee Hua  Call M.D.   On: 03/16/2013 14:32    Scheduled Meds: . ciprofloxacin  400 mg Intravenous Q12H  . pantoprazole (PROTONIX) IV  40 mg Intravenous Q24H   Continuous Infusions: . sodium chloride 50 mL/hr at 03/15/13 1212  . dextrose 5 % and 0.45 % NaCl with KCl 20 mEq/L 50 mL/hr at 03/16/13 1501  . lactated ringers      Principal Problem:   Obstructive jaundice Active Problems:   Biliary obstruction   h/o Hepatitis E   Elevated LFTs   Itching   Cholelithiasis  Time spent:  Kenyada Hy K  [facility] Pager [phone]. If 7PM-7AM, please contact night-coverage at [email or web address], password [facility] 03/17/2013, 9:35 AM  LOS: 5 days

## 2013-03-17 NOTE — Telephone Encounter (Signed)
Pts daughter given po appt date. She states pt is still IP but will keep po appt.

## 2013-03-17 NOTE — Anesthesia Postprocedure Evaluation (Signed)
  Anesthesia Post-op Note  Patient: Vicki Chavez  Procedure(s) Performed: Procedure(s): LAPAROSCOPIC CHOLECYSTECTOMY WITH INTRAOPERATIVE CHOLANGIOGRAM (N/A)  Patient Location: Nursing Unit  Anesthesia Type:General  Level of Consciousness: awake, alert  and oriented  Airway and Oxygen Therapy: Patient Spontanous Breathing  Post-op Pain: none  Post-op Assessment: Post-op Vital signs reviewed, Patient's Cardiovascular Status Stable, Respiratory Function Stable, Patent Airway, No signs of Nausea or vomiting, Adequate PO intake and Pain level controlled  Post-op Vital Signs: Reviewed and stable  Complications: No apparent anesthesia complications

## 2013-03-18 ENCOUNTER — Encounter (HOSPITAL_COMMUNITY): Payer: Self-pay | Admitting: Surgery

## 2013-03-18 LAB — COMPREHENSIVE METABOLIC PANEL
AST: 259 U/L — ABNORMAL HIGH (ref 0–37)
Albumin: 2.6 g/dL — ABNORMAL LOW (ref 3.5–5.2)
Alkaline Phosphatase: 417 U/L — ABNORMAL HIGH (ref 39–117)
BUN: 9 mg/dL (ref 6–23)
CO2: 26 mEq/L (ref 19–32)
Calcium: 9.1 mg/dL (ref 8.4–10.5)
Chloride: 101 mEq/L (ref 96–112)
Creatinine, Ser: 0.6 mg/dL (ref 0.50–1.10)
GFR calc non Af Amer: 83 mL/min — ABNORMAL LOW (ref >90)
Sodium: 136 mEq/L (ref 135–145)

## 2013-03-18 MED ORDER — ALUM & MAG HYDROXIDE-SIMETH 200-200-20 MG/5ML PO SUSP: 15.0000 mL | Freq: Four times a day (QID) | ORAL | Status: DC | PRN

## 2013-03-18 MED ORDER — MENTHOL 3 MG MT LOZG: 1.0000 | LOZENGE | OROMUCOSAL | Status: DC | PRN

## 2013-03-18 MED ORDER — BISACODYL 10 MG RE SUPP
10.0000 mg | Freq: Once | RECTAL | Status: DC
  Filled 2013-03-18: qty 1

## 2013-03-18 MED ORDER — TRAMADOL HCL 50 MG PO TABS: 25.0000 mg | ORAL_TABLET | Freq: Four times a day (QID) | ORAL | Status: DC | PRN

## 2013-03-18 MED ORDER — OXYCODONE HCL 5 MG PO TABS: 5.0000 mg | ORAL_TABLET | ORAL | Status: DC | PRN

## 2013-03-18 NOTE — Progress Notes (Signed)
[facility]  Patient seen and examined.  Much improved today.  Daughter at bedside.  Tolerating diet.  Pain better controlled.  Ambulated.  Prepared for discharge.  Will see at [facility] office.  Check labs as out-patient.  Velora Heckler, MD, [facility] Office: [phone]

## 2013-03-18 NOTE — Progress Notes (Signed)
2 Days Post-Op  Subjective: Pt feels much better overall, but went 12 hours without pain medication and now she's in pain.  Walked once today.  Tolerating diet, had some increase in pain with breakfast (overlapping with not taking pain meds for 12 hours).  Still complaining of a lot of bloating.  Hasn't walked very much due to fatigue.  Had small BM yesterday with suppository.      Objective: Vital signs in last 24 hours: Temp:  [98.3 F (36.8 C)-99.9 F (37.7 C)] 98.3 F (36.8 C) (12/11 0500) Pulse Rate:  [75-89] 75 (12/11 0500) Resp:  [18-19] 18 (12/11 0500) BP: (105-147)/(55-67) 105/55 mmHg (12/11 0500) SpO2:  [97 %-100 %] 97 % (12/11 0500) Last BM Date: 03/14/13  Intake/Output from previous day: 12/10 0701 - 12/11 0700 In: 240 [P.O.:240] Out: 1850 [Urine:1850] Intake/Output this shift:    PE: Gen:  Alert, NAD, pleasant Abd: Soft, appropriately tender, mild distension, +BS, no HSM, incisions C/D/I  Lab Results:   Recent Labs  03/16/13 0810 03/17/13 0605  WBC 5.6 6.6  HGB 12.0 10.0*  HCT 35.6* 30.7*  PLT 327 265   BMET  Recent Labs  03/17/13 0605 03/18/13 0355  NA 134* 136  K 3.7 3.5  CL 100 101  CO2 26 26  GLUCOSE 132* 105*  BUN 11 9  CREATININE 0.61 0.60  CALCIUM 8.5 9.1   PT/INR No results found for this basename: LABPROT, INR,  in the last 72 hours CMP     Component Value Date/Time   NA 136 03/18/2013 0355   K 3.5 03/18/2013 0355   CL 101 03/18/2013 0355   CO2 26 03/18/2013 0355   GLUCOSE 105* 03/18/2013 0355   BUN 9 03/18/2013 0355   CREATININE 0.60 03/18/2013 0355   CALCIUM 9.1 03/18/2013 0355   PROT 6.2 03/18/2013 0355   ALBUMIN 2.6* 03/18/2013 0355   AST 259* 03/18/2013 0355   ALT 340* 03/18/2013 0355   ALKPHOS 417* 03/18/2013 0355   BILITOT 2.1* 03/18/2013 0355   GFRNONAA 83* 03/18/2013 0355   GFRAA >90 03/18/2013 0355   Lipase     Component Value Date/Time   LIPASE 41 03/16/2013 0555       Studies/Results: Dg  Cholangiogram Operative  03/16/2013   CLINICAL DATA:  History of cholelithiasis and choledocholithiasis.  EXAM: INTRAOPERATIVE CHOLANGIOGRAM  TECHNIQUE: Cholangiographic images from the C-arm fluoroscopic device were submitted for interpretation post-operatively. Please see the procedural report for the amount of contrast and the fluoroscopy time utilized.  COMPARISON:  ERCP 03/14/2013.  FINDINGS: There is dilatation of the intrahepatic and extrahepatic bile ducts with dilatation of the common bile duct. No definite filling defects are seen to suggest choledocholithiasis. There is free flow of contrast into the duodenum without evidence of common bile duct obstruction.  IMPRESSION: Dilatation of bile ducts. Dilatation of common bile duct. No definite filling defects are seen to suggest choledocholithiasis. There is free flow of contrast into the duodenum without evidence of common bile duct obstruction.   Electronically Signed   By: Onalee Hua  Call M.D.   On: 03/16/2013 14:32    Anti-infectives: Anti-infectives   Start     Dose/Rate Route Frequency Ordered Stop   03/15/13 0600  ciprofloxacin (CIPRO) IVPB 400 mg  Status:  Discontinued     400 mg 200 mL/hr over 60 Minutes Intravenous On call to O.R. 03/14/13 1319 03/16/13 1322   03/13/13 1200  ciprofloxacin (CIPRO) IVPB 400 mg     400 mg 200 mL/hr  over 60 Minutes Intravenous Every 12 hours 03/13/13 0646     03/13/13 0015  ciprofloxacin (CIPRO) IVPB 400 mg     400 mg 200 mL/hr over 60 Minutes Intravenous  Once 03/13/13 0000 03/13/13 0143       Assessment/Plan Chronic choledocholithiasis, cholelithiasis, and acute cholecystitis s/p ERCP and POD #2 lap chole with IOC  Hyperbilirubinemia - improved to 2.1  Plan:  1. Advance to vegetarian diet  2. KVO IVF  3. Ambulate and IS  4. SCD's and lovenox  5. LFT's overall improving, would recommend recheck of LFT's on Monday 6. Cont Oxy IR, avoid tylenol with LFT's up   7. D/C cipro had 5/5 days and she  is afebrile and normal wbc 8. F/u with Dr. Gerrit Friends in 2-3 weeks (family's requesting to see him only)  9. Bowel regimen 10.  D/C today after tolerating lunch well     LOS: 6 days    DORT, Gladstone Rosas 03/18/2013, 9:39 AM Pager: [phone]

## 2013-03-18 NOTE — Discharge Summary (Signed)
Physician Discharge Summary  Hollan Philipp ZOX:[id number] DOB: [birth year] DOA: 03/12/2013  PCP: Pcp Not In System  Admit date: 03/12/2013 Discharge date: 03/18/2013  Time spent: 35 minutes  Recommendations for Outpatient Follow-up:  - Follow up with PCP in 1-2 weeks - Would repeat Comprehensive Metabolic Profile within 5 days, focusing on LFT's - Follow up with Surgery as scheduled  Discharge Diagnoses:  Principal Problem:   Obstructive jaundice Active Problems:   Biliary obstruction   h/o Hepatitis E   Elevated LFTs   Itching   Cholelithiasis   Discharge Condition: Improved  Diet recommendation: Vegetarian  Filed Weights   03/12/13 1628  Weight: 50.984 kg (112 lb 6.4 oz)    History of present illness:  Vicki Chavez is a 79 y.o. female with Past medical history of hypertension, prediabetes, goes to, hepatitis E.  The patient is coming from home. The patient presented with the complaints of right upper quadrant pain that has been ongoing since last one week. This was associated with fatigue and increased itchiness. She started noticing yellow discoloration of her urine as well and therefore she came to the hospital today. She denies any complaint of fever or chills. She mentions that she has some discomfort in her abdomen which gets worse with feeding. She denies any diarrhea or black color bowel movements or constipation. She mentions she has history of hepatitis B which he encountered in [place] and she also had a history of GI bleeding for which she went to [place] to fix it. At present she denies any complaint of chest pain, shortness of breath, palpitation, dizziness, lightheadedness, nausea, vomiting, burning urination, but diarrhea or constipation. She denies any similar symptoms in the past. She mentions 3 years ago she has been diagnosed with gallstone but did not have any symptoms therefore did not have any further workup.  Hospital Course:  Acute abdominal pain secondary to  possible choledocholithiasis  -Patient has obstructive jaundice  -Gastroenterology was consulted  -ERCP 12/7. Large distal stone with diffuse biliary tree dilatation  -Continued on ciprofloxacin and Protonix  -Surgery consulted for lap chole, s/p surgery on 03/16/13  -Tolerated vegetarian diet History of hepatitis E  -Records from June of 2006 show acute hepatitis B with liver biopsy showing acute periportal inflammation, no granulomas, negative trichrome stain consistent with acute hepatitis  -Gastroenterology following  Elevated LFTs and biliary obstruction  -Overall stable with gradual improvement  Hyponatremia  -Na stable  -Continue IVF  Lower extremity numbness  -Chronic  -May possibly consider Neurontin if this persists  Procedures:  ERCP on 127/14  Lap chole on 03/16/13  Consultations:  GI  General Surgery  Discharge Exam: Filed Vitals:   03/17/13 0453 03/17/13 1348 03/17/13 2136 03/18/13 0500  BP: 117/46 118/67 147/58 105/55  Pulse: 85 84 89 75  Temp: 98 F (36.7 C) 98.8 F (37.1 C) 99.9 F (37.7 C) 98.3 F (36.8 C)  TempSrc: Oral Oral Oral   Resp: 18 18 19 18   Height:      Weight:      SpO2: 100% 100% 98% 97%    General: Awake, in nad Cardiovascular: regular, s1, s2 Respiratory: normal resp effor,t no wheezing  Discharge Instructions     Medication List         alendronate 70 MG tablet  Commonly known as:  FOSAMAX  Take 70 mg by mouth once a week. wednesdays     aspirin 81 MG tablet  Take 81 mg by mouth daily.  b complex vitamins tablet  Take 1 tablet by mouth daily.     calcium-vitamin D 500-200 MG-UNIT per tablet  Commonly known as:  OSCAL WITH D  Take 1 tablet by mouth 2 (two) times daily.     Iron 325 (65 FE) MG Tabs  Take 1 tablet by mouth once a week. fridays     moexipril-hydrochlorothiazide 7.5-12.5 MG per tablet  Commonly known as:  UNIRETIC  Take 1 tablet by mouth daily.     oxyCODONE 5 MG immediate release tablet   Commonly known as:  Oxy IR/ROXICODONE  Take 1 tablet (5 mg total) by mouth every 4 (four) hours as needed for moderate pain.     rosuvastatin 5 MG tablet  Commonly known as:  CRESTOR  Take 5 mg by mouth at bedtime.     traMADol 50 MG tablet  Commonly known as:  ULTRAM  Take 0.5 tablets (25 mg total) by mouth every 6 (six) hours as needed for moderate pain.       Allergies  Allergen Reactions  . Penicillins     Does not remember   Follow-up Information   Follow up with Velora Heckler, MD. Schedule an appointment as soon as possible for a visit on 04/05/2013. (Appt at 10:15am.  Please arrive at least 30 minutes before your appointment with the general surgeon to complete your check in paperwork.  If you are unable to arrive 30 minutes prior to your appointment time we may have to cancel or reschedule you.)    Specialty:  General Surgery   Contact information:   [address] [place] [address] [phone]       Schedule an appointment as soon as possible for a visit with Follow up with PCP as soon as you are able to.      Follow up with Follow up with Urgent Care for additional issues.       The results of significant diagnostics from this hospitalization (including imaging, microbiology, ancillary and laboratory) are listed below for reference.    Significant Diagnostic Studies: Dg Cholangiogram Operative  03/16/2013   CLINICAL DATA:  History of cholelithiasis and choledocholithiasis.  EXAM: INTRAOPERATIVE CHOLANGIOGRAM  TECHNIQUE: Cholangiographic images from the C-arm fluoroscopic device were submitted for interpretation post-operatively. Please see the procedural report for the amount of contrast and the fluoroscopy time utilized.  COMPARISON:  ERCP 03/14/2013.  FINDINGS: There is dilatation of the intrahepatic and extrahepatic bile ducts with dilatation of the common bile duct. No definite filling defects are seen to suggest choledocholithiasis. There is free  flow of contrast into the duodenum without evidence of common bile duct obstruction.  IMPRESSION: Dilatation of bile ducts. Dilatation of common bile duct. No definite filling defects are seen to suggest choledocholithiasis. There is free flow of contrast into the duodenum without evidence of common bile duct obstruction.   Electronically Signed   By: Onalee Hua  Call M.D.   On: 03/16/2013 14:32   US Abdomen Complete  03/12/2013   CLINICAL DATA:  Abdominal pain.  EXAM: ULTRASOUND ABDOMEN COMPLETE  COMPARISON:  None.  FINDINGS: Gallbladder:  Multiple echogenic structures within the gallbladder are compatible with stones. Mild distention of the gallbladder. No significant gallbladder wall thickening. Reportedly, the patient does not have a sonographic Murphy's sign. Largest gallstone measures 1.2 cm. There are echogenic structures along the gallbladder wall that are suggestive for polyps and the largest measures 0.7 cm.  Common bile duct:  Diameter: Common bile duct is markedly  dilated, measuring up to 1.5 cm.  Liver:  No focal lesion identified. Within normal limits in parenchymal echogenicity.  IVC:  No abnormality visualized.  Pancreas:  Visualized portion unremarkable.  Spleen:  Poorly visualized.  Right Kidney:  Length: 9.7 cm. Round hypoechoic structure in the right kidney lower pole measures 2.7 x 1.8 x 1.9 cm. The may be a septation within this structure and findings are suggestive for a cyst.  Left Kidney:  Length: 10.0 cm. There is mild fullness in left renal collecting system.  Abdominal aorta:  No aneurysm visualized.  Other findings:  None.  IMPRESSION: The gallbladder contains multiple echogenic stones. In addition, there appears to be gallbladder polyps.  Marked dilatation of the common bile duct, measuring up to 1.5 cm. Findings raise concern for a distal common bile duct obstruction.  Mild fullness or hydronephrosis in the left kidney of unknown etiology.  The common bile duct and left kidney findings  could be further evaluated with CT.  Right renal cyst.  This cyst may be mildly complex as described.   Electronically Signed   By: Richarda Overlie M.D.   On: 03/12/2013 23:35   Dg Ercp Biliary & Pancreatic Ducts  03/14/2013   CLINICAL DATA:  Biliary obstruction with obstructive jaundice.  EXAM: ERCP with sphincterotomy  TECHNIQUE: Multiple spot images obtained with the fluoroscopic device and submitted for interpretation post-procedure.  COMPARISON:  Abdomen ultrasound dated 03/12/2013.  FINDINGS: Endoscope in place with contrast opacification of a diffusely dilated common duct. Mildly dilated intrahepatic ducts. Dilated distal cystic duct without opacification of the gallbladder. Multiple filling defects in the common duct on some of the images. Some of these are compatible with introduced air bubbles and some are compatible with at least 1 gallstone in the common duct. Stones were reportedly removed following sphincterotomy.  IMPRESSION: Choledocholithiasis with ductal dilatation and stone removal as described above.  These images were submitted for radiologic interpretation only. Please see the procedural report for the amount of contrast and the fluoroscopy time utilized.   Electronically Signed   By: Gordan Payment M.D.   On: 03/14/2013 18:59    Microbiology: Recent Results (from the past 240 hour(s))  SURGICAL PCR SCREEN     Status: None   Collection Time    03/15/13  5:44 AM      Result Value Range Status   MRSA, PCR NEGATIVE  NEGATIVE Final   Staphylococcus aureus NEGATIVE  NEGATIVE Final   Comment:            The Xpert SA Assay (FDA     approved for NASAL specimens     in patients over 70 years of age),     is one component of     a comprehensive surveillance     program.  Test performance has     been validated by [employer] for patients greater     than or equal to 31 year old.     It is not intended     to diagnose infection nor to     guide or monitor treatment.      Labs: Basic Metabolic Panel:  Recent Labs Lab 03/15/13 0655 03/16/13 0810 03/17/13 0003 03/17/13 0605 03/18/13 0355  NA 136 136 136 134* 136  K 4.3 3.7 4.1 3.7 3.5  CL 103 101 104 100 101  CO2 26 25 27 26 26   GLUCOSE 116* 128* 135* 132* 105*  BUN 10 11 10 11  9  CREATININE 0.65 0.62 0.62 0.61 0.60  CALCIUM 9.1 9.3 8.7 8.5 9.1   Liver Function Tests:  Recent Labs Lab 03/14/13 0540 03/15/13 0045 03/15/13 0655 03/17/13 0003 03/18/13 0355  AST 364* 262* 230* 281* 259*  ALT 430* 364* 345* 356* 340*  ALKPHOS 638* 568* 581* 466* 417*  BILITOT 5.7* 6.2* 4.4* 2.5* 2.1*  PROT 6.7 6.4 6.8 6.2 6.2  ALBUMIN 2.9* 2.8* 3.0* 2.8* 2.6*    Recent Labs Lab 03/12/13 1646 03/13/13 0830 03/15/13 0045 03/16/13 0555  LIPASE 86* 51 39 41  AMYLASE  --  101  --   --    No results found for this basename: AMMONIA,  in the last 168 hours CBC:  Recent Labs Lab 03/12/13 1646 03/13/13 0750 03/16/13 0810 03/17/13 0605  WBC 5.2 5.8 5.6 6.6  NEUTROABS 2.9  --   --   --   HGB 12.2 11.9* 12.0 10.0*  HCT 35.5* 34.6* 35.6* 30.7*  MCV 79.2 79.4 80.9 82.5  PLT 323 292 327 265   Cardiac Enzymes: No results found for this basename: CKTOTAL, CKMB, CKMBINDEX, TROPONINI,  in the last 168 hours BNP: BNP (last 3 results) No results found for this basename: PROBNP,  in the last 8760 hours CBG:  Recent Labs Lab 03/15/13 0715 03/16/13 0532 03/17/13 0734 03/17/13 1144 03/18/13 0757  GLUCAP 123* 109* 138* 174* 106*    Signed:  CHIU, STEPHEN K  [facility] 03/18/2013, 3:00 PM

## 2013-03-25 LAB — CBC
HCT: 36 % (ref 36.0–46.0)
MCV: 82.9 fL (ref 78.0–100.0)
Platelets: 383 10*3/uL (ref 150–400)
RBC: 4.34 MIL/uL (ref 3.87–5.11)
RDW: 15.2 % (ref 11.5–15.5)
WBC: 6.4 10*3/uL (ref 4.0–10.5)

## 2013-03-25 LAB — COMPREHENSIVE METABOLIC PANEL
ALT: 150 U/L — ABNORMAL HIGH (ref 0–35)
Albumin: 3.9 g/dL (ref 3.5–5.2)
BUN: 19 mg/dL (ref 6–23)
CO2: 29 mEq/L (ref 19–32)
Calcium: 9.6 mg/dL (ref 8.4–10.5)
Chloride: 102 mEq/L (ref 96–112)
Creat: 0.52 mg/dL (ref 0.50–1.10)
Sodium: 139 mEq/L (ref 135–145)

## 2013-03-26 ENCOUNTER — Telehealth (INDEPENDENT_AMBULATORY_CARE_PROVIDER_SITE_OTHER): Payer: Self-pay | Admitting: *Deleted

## 2013-03-26 NOTE — Telephone Encounter (Signed)
Tried to call pt to give her the lab results per [facility] D.  Left a message for pt to call the office and ask for Triage.Marland Kitchenjkw

## 2013-04-05 ENCOUNTER — Ambulatory Visit (INDEPENDENT_AMBULATORY_CARE_PROVIDER_SITE_OTHER): Payer: PRIVATE HEALTH INSURANCE | Admitting: Surgery

## 2013-04-05 ENCOUNTER — Encounter (INDEPENDENT_AMBULATORY_CARE_PROVIDER_SITE_OTHER): Payer: Self-pay | Admitting: Surgery

## 2013-04-05 VITALS — BP 136/60 | HR 78 | Temp 98.3°F | Resp 10 | Ht <= 58 in | Wt 109.0 lb

## 2013-04-05 DIAGNOSIS — K8020 Calculus of gallbladder without cholecystitis without obstruction: Secondary | ICD-10-CM

## 2013-04-05 DIAGNOSIS — K831 Obstruction of bile duct: Secondary | ICD-10-CM

## 2013-04-05 NOTE — Progress Notes (Signed)
General Surgery [facility]  Chief Complaint  Patient presents with  . Routine Post Op    lap chole 03/16/2013    HISTORY: The patient is an 79 year old female who underwent laparoscopic cholecystectomy on 03/16/2013. Postoperatively she did well. Liver function tests were checked 11 days ago and were in improving but not yet returned to normal range.  Patient states that she is eating better. She still feels weak. She complains of headache. She complains of burning in her feet.  Patient denies nausea or vomiting. She denies fevers or chills. She is having one formed bowel movement daily.  EXAM: Abdomen is soft without distention. Surgical wounds are healing nicely. No sign of infection. No sign of herniation. Right upper quadrant is soft and nontender without mass.  IMPRESSION: Status post laparoscopic cholecystectomy for chronic cholecystitis and cholelithiasis  PLAN: I had an extensive discussion today with the patient and her son. I answered all their questions. I would like to obtain a hepatic function profile to make sure that all of her liver enzymes have returned to the normal range. We will give her a lab slip for this test.  I have encouraged the patient to have her primary care provider in [place] contact our office for records of her hospitalization and surgical care.  Patient will return for surgical care as needed.  Velora Heckler, MD, FACS General & Endocrine Surgery [facility]   Visit Diagnoses: 1. Biliary obstruction   2. Cholelithiasis

## 2013-04-05 NOTE — Patient Instructions (Signed)
  COCOA BUTTER & VITAMIN E CREAM  ([employer] or other brand)  Apply cocoa butter/vitamin E cream to your incision 2 - 3 times daily.  Massage cream into incision for one minute with each application.  Use sunscreen (50 SPF or higher) for first 6 months after surgery if area is exposed to sun.  You may substitute Mederma or other scar reducing creams as desired.   

## 2013-07-29 ENCOUNTER — Emergency Department (HOSPITAL_COMMUNITY): Payer: Medicaid - Out of State

## 2013-07-29 ENCOUNTER — Encounter (HOSPITAL_COMMUNITY): Payer: Self-pay | Admitting: Emergency Medicine

## 2013-07-29 ENCOUNTER — Emergency Department (INDEPENDENT_AMBULATORY_CARE_PROVIDER_SITE_OTHER)
Admission: EM | Admit: 2013-07-29 | Discharge: 2013-07-29 | Disposition: A | Discharge status: Nursing Home (Includes ICF) | Payer: 59 | Source: Home / Self Care | Attending: Family Medicine | Admitting: Family Medicine

## 2013-07-29 ENCOUNTER — Inpatient Hospital Stay (HOSPITAL_COMMUNITY): Payer: Medicaid - Out of State

## 2013-07-29 ENCOUNTER — Inpatient Hospital Stay (HOSPITAL_COMMUNITY)
Admission: EM | Admit: 2013-07-29 | Discharge: 2013-08-02 | DRG: 435 | Disposition: A | Discharge status: Home / Self Care | Payer: Medicaid - Out of State | Attending: Internal Medicine | Admitting: Internal Medicine

## 2013-07-29 DIAGNOSIS — M542 Cervicalgia: Secondary | ICD-10-CM | POA: Diagnosis present

## 2013-07-29 DIAGNOSIS — M810 Age-related osteoporosis without current pathological fracture: Secondary | ICD-10-CM | POA: Diagnosis present

## 2013-07-29 DIAGNOSIS — R1110 Vomiting, unspecified: Secondary | ICD-10-CM

## 2013-07-29 DIAGNOSIS — K831 Obstruction of bile duct: Secondary | ICD-10-CM

## 2013-07-29 DIAGNOSIS — F411 Generalized anxiety disorder: Secondary | ICD-10-CM | POA: Diagnosis present

## 2013-07-29 DIAGNOSIS — E785 Hyperlipidemia, unspecified: Secondary | ICD-10-CM

## 2013-07-29 DIAGNOSIS — M545 Low back pain, unspecified: Secondary | ICD-10-CM

## 2013-07-29 DIAGNOSIS — Z7982 Long term (current) use of aspirin: Secondary | ICD-10-CM

## 2013-07-29 DIAGNOSIS — C241 Malignant neoplasm of ampulla of Vater: Principal | ICD-10-CM | POA: Diagnosis present

## 2013-07-29 DIAGNOSIS — R109 Unspecified abdominal pain: Secondary | ICD-10-CM

## 2013-07-29 DIAGNOSIS — I10 Essential (primary) hypertension: Secondary | ICD-10-CM

## 2013-07-29 DIAGNOSIS — R7989 Other specified abnormal findings of blood chemistry: Secondary | ICD-10-CM | POA: Diagnosis present

## 2013-07-29 DIAGNOSIS — B172 Acute hepatitis E: Secondary | ICD-10-CM

## 2013-07-29 DIAGNOSIS — R42 Dizziness and giddiness: Secondary | ICD-10-CM | POA: Diagnosis present

## 2013-07-29 DIAGNOSIS — Z79899 Other long term (current) drug therapy: Secondary | ICD-10-CM

## 2013-07-29 DIAGNOSIS — K8020 Calculus of gallbladder without cholecystitis without obstruction: Secondary | ICD-10-CM

## 2013-07-29 DIAGNOSIS — K838 Other specified diseases of biliary tract: Secondary | ICD-10-CM

## 2013-07-29 DIAGNOSIS — R51 Headache: Secondary | ICD-10-CM

## 2013-07-29 DIAGNOSIS — R112 Nausea with vomiting, unspecified: Secondary | ICD-10-CM

## 2013-07-29 DIAGNOSIS — R1033 Periumbilical pain: Secondary | ICD-10-CM | POA: Diagnosis present

## 2013-07-29 DIAGNOSIS — R519 Headache, unspecified: Secondary | ICD-10-CM

## 2013-07-29 DIAGNOSIS — R945 Abnormal results of liver function studies: Secondary | ICD-10-CM

## 2013-07-29 LAB — CBC WITH DIFFERENTIAL/PLATELET
BASOS PCT: 0 % (ref 0–1)
Basophils Absolute: 0 10*3/uL (ref 0.0–0.1)
EOS ABS: 0.1 10*3/uL (ref 0.0–0.7)
EOS PCT: 2 % (ref 0–5)
HCT: 37.5 % (ref 36.0–46.0)
Hemoglobin: 12.5 g/dL (ref 12.0–15.0)
Lymphocytes Relative: 26 % (ref 12–46)
Lymphs Abs: 1.9 10*3/uL (ref 0.7–4.0)
MCH: 26.4 pg (ref 26.0–34.0)
MCHC: 33.3 g/dL (ref 30.0–36.0)
MCV: 79.1 fL (ref 78.0–100.0)
MONOS PCT: 7 % (ref 3–12)
Monocytes Absolute: 0.5 10*3/uL (ref 0.1–1.0)
Neutro Abs: 4.9 10*3/uL (ref 1.7–7.7)
Neutrophils Relative %: 65 % (ref 43–77)
Platelets: 242 10*3/uL (ref 150–400)
RBC: 4.74 MIL/uL (ref 3.87–5.11)
RDW: 15.1 % (ref 11.5–15.5)
WBC: 7.4 10*3/uL (ref 4.0–10.5)

## 2013-07-29 LAB — URINE MICROSCOPIC-ADD ON

## 2013-07-29 LAB — COMPREHENSIVE METABOLIC PANEL
ALT: 104 U/L — ABNORMAL HIGH (ref 0–35)
AST: 142 U/L — ABNORMAL HIGH (ref 0–37)
Albumin: 3.7 g/dL (ref 3.5–5.2)
Alkaline Phosphatase: 235 U/L — ABNORMAL HIGH (ref 39–117)
BUN: 23 mg/dL (ref 6–23)
CALCIUM: 9.1 mg/dL (ref 8.4–10.5)
CO2: 23 mEq/L (ref 19–32)
CREATININE: 0.54 mg/dL (ref 0.50–1.10)
Chloride: 103 mEq/L (ref 96–112)
GFR calc non Af Amer: 86 mL/min — ABNORMAL LOW (ref >90)
Glucose, Bld: 139 mg/dL — ABNORMAL HIGH (ref 70–99)
Potassium: 4.4 mEq/L (ref 3.7–5.3)
Sodium: 139 mEq/L (ref 137–147)
TOTAL PROTEIN: 7.5 g/dL (ref 6.0–8.3)
Total Bilirubin: 0.8 mg/dL (ref 0.3–1.2)

## 2013-07-29 LAB — URINALYSIS, ROUTINE W REFLEX MICROSCOPIC
Bilirubin Urine: NEGATIVE
Glucose, UA: NEGATIVE mg/dL
HGB URINE DIPSTICK: NEGATIVE
Ketones, ur: NEGATIVE mg/dL
Nitrite: NEGATIVE
PROTEIN: NEGATIVE mg/dL
Specific Gravity, Urine: 1.008 (ref 1.005–1.030)
Urobilinogen, UA: 0.2 mg/dL (ref 0.0–1.0)
pH: 7.5 (ref 5.0–8.0)

## 2013-07-29 LAB — I-STAT CG4 LACTIC ACID, ED: Lactic Acid, Venous: 0.68 mmol/L (ref 0.5–2.2)

## 2013-07-29 LAB — I-STAT TROPONIN, ED: Troponin i, poc: 0.01 ng/mL (ref 0.00–0.08)

## 2013-07-29 MED ORDER — MECLIZINE HCL 25 MG PO TABS
25.0000 mg | ORAL_TABLET | Freq: Once | ORAL | Status: AC
  Administered 2013-07-29: 25 mg via ORAL
  Filled 2013-07-29: qty 1

## 2013-07-29 MED ORDER — ONDANSETRON HCL 4 MG/2ML IJ SOLN
4.0000 mg | Freq: Once | INTRAMUSCULAR | Status: AC
  Administered 2013-07-29: 4 mg via INTRAVENOUS
  Filled 2013-07-29: qty 2

## 2013-07-29 MED ORDER — SODIUM CHLORIDE 0.9 % IV BOLUS (SEPSIS)
1000.0000 mL | Freq: Once | INTRAVENOUS | Status: AC
  Administered 2013-07-29: 1000 mL via INTRAVENOUS

## 2013-07-29 MED ORDER — HYDRALAZINE HCL 20 MG/ML IJ SOLN
10.0000 mg | Freq: Three times a day (TID) | INTRAMUSCULAR | Status: DC
  Administered 2013-07-30: 10 mg via INTRAVENOUS
  Filled 2013-07-29 (×10): qty 0.5
  Filled 2013-07-29: qty 1
  Filled 2013-07-29 (×2): qty 0.5

## 2013-07-29 MED ORDER — MORPHINE SULFATE 4 MG/ML IJ SOLN
4.0000 mg | Freq: Once | INTRAMUSCULAR | Status: AC
Start: 2013-07-29 — End: 2013-07-29
  Administered 2013-07-29: 4 mg via INTRAVENOUS
  Filled 2013-07-29: qty 1

## 2013-07-29 MED ORDER — IOHEXOL 300 MG/ML  SOLN
80.0000 mL | Freq: Once | INTRAMUSCULAR | Status: AC | PRN
  Administered 2013-07-29: 80 mL via INTRAVENOUS

## 2013-07-29 MED ORDER — MORPHINE SULFATE 4 MG/ML IJ SOLN: 4.0000 mg | Freq: Once | INTRAMUSCULAR | Status: DC

## 2013-07-29 MED ORDER — DIAZEPAM 2 MG PO TABS
2.0000 mg | ORAL_TABLET | Freq: Once | ORAL | Status: AC
  Administered 2013-07-29: 2 mg via ORAL
  Filled 2013-07-29: qty 1

## 2013-07-29 NOTE — ED Notes (Signed)
Helped pt walk to the bathroom to get urine sample

## 2013-07-29 NOTE — ED Notes (Signed)
Pt actively vomiting when returned from x-ray- Zofran given.

## 2013-07-29 NOTE — ED Notes (Signed)
Pt c/o lower back pain onset 2-4 days; getting worse Hurts to walk; slow steady gait, assisted by son Denies inj/trauma Also c/o abd pain around naval area Sx include: HA, nauseas, vomiting, decreased appetite, dizziness Denies urinary sx, fevers Pt is alert w/pain level 10/10; pt's son is in the room

## 2013-07-29 NOTE — ED Notes (Signed)
Pt got morphine prior to orthostatic VS

## 2013-07-29 NOTE — Progress Notes (Signed)
Called to get report from ED, secretary forwarded call but no answer.

## 2013-07-29 NOTE — ED Notes (Signed)
Helped pt to bathroom and then Patient transported to CT

## 2013-07-29 NOTE — ED Notes (Signed)
Sent from urgent care with lower back and lower abdominal pain with nausea for last 2-4 days.  Pt reports headache, dizziness, and numbness in hands that started today in the morning.  Pt reports vomiting.

## 2013-07-29 NOTE — ED Provider Notes (Signed)
CSN: [id number]     Arrival date & time 07/29/13  1518 History   First MD Initiated Contact with Patient 07/29/13 1623     Chief Complaint  Patient presents with  . Dizziness  . Headache  . Abdominal Pain  . Back Pain     (Consider location/radiation/quality/duration/timing/severity/associated sxs/prior Treatment) HPI  This is an 80 year old female with history of hypertension and osteoporosis who presents with multiple complaints. She was seen and evaluated at urgent care earlier today. Patient reports headache for 3-4 days, room spinning dizziness, back pain, and abdominal pain with vomiting. Patient reports that she has had a 2 to three-day history of headache. She reports occipital headache. Patient reports room spinning dizziness with associated vomiting. She denies any bilious or bloody emesis. Patient also reports epigastric abdominal pain and bloating that is nonradiating. Currently her pain is 8/10. She denies any fevers. She has a history of cholecystectomy. Last bowel movement was yesterday. Patient reports lower lumbar pain that is nonradiating. She reports that she is so much pain she is having difficulty walking. She denies any bowel or bladder difficulties. She denies any weakness or numbness. She has taken NSAIDs without relief.  Past Medical History  Diagnosis Date  . Hypertension   . High cholesterol   . Osteoporosis   . Prediabetes   . Gall stones    Past Surgical History  Procedure Laterality Date  . Ercp N/A 03/14/2013    Procedure: ENDOSCOPIC RETROGRADE CHOLANGIOPANCREATOGRAPHY (ERCP),stone removal, possible sphincterotomy;  Surgeon: Missy Sabins, MD;  Location: [place];  Service: Endoscopy;  Laterality: N/A;  . Cholecystectomy N/A 03/16/2013    Procedure: LAPAROSCOPIC CHOLECYSTECTOMY WITH INTRAOPERATIVE CHOLANGIOGRAM;  Surgeon: Earnstine Regal, MD;  Location: [place];  Service: General;  Laterality: N/A;   No family history on file. History  Substance Use  Topics  . Smoking status: Never Smoker   . Smokeless tobacco: Not on file  . Alcohol Use: No   OB History   Grav Para Term Preterm Abortions TAB SAB Ect Mult Living                 Review of Systems  Constitutional: Negative for fever.  Eyes: Negative for photophobia.  Respiratory: Negative for cough, chest tightness and shortness of breath.   Cardiovascular: Negative for chest pain.  Gastrointestinal: Positive for nausea, vomiting and abdominal pain. Negative for diarrhea, constipation, blood in stool and rectal pain.  Genitourinary: Negative for dysuria.  Musculoskeletal: Negative for back pain.  Skin: Negative for wound.  Neurological: Positive for dizziness and headaches. Negative for facial asymmetry, weakness, light-headedness and numbness.  Psychiatric/Behavioral: Negative for confusion.  All other systems reviewed and are negative.     Allergies  Penicillins  Home Medications   Prior to Admission medications   Medication Sig Start Date End Date Taking? Authorizing Provider  alendronate (FOSAMAX) 70 MG tablet Take 70 mg by mouth once a week. wednesdays   Yes Historical Provider, MD  aspirin 81 MG tablet Take 81 mg by mouth daily.   Yes Historical Provider, MD  b complex vitamins tablet Take 1 tablet by mouth daily.   Yes Historical Provider, MD  calcium-vitamin D (OSCAL WITH D) 500-200 MG-UNIT per tablet Take 1 tablet by mouth 2 (two) times daily.   Yes Historical Provider, MD  Ferrous Sulfate (IRON) 325 (65 FE) MG TABS Take 1 tablet by mouth once a week. fridays   Yes Historical Provider, MD  moexipril-hydrochlorothiazide (UNIRETIC) 7.5-12.5 MG per tablet Take  1 tablet by mouth daily.   Yes Historical Provider, MD  rosuvastatin (CRESTOR) 5 MG tablet Take 5 mg by mouth at bedtime.    Yes Historical Provider, MD  traMADol-acetaminophen (ULTRACET) 37.5-325 MG per tablet Take 1 tablet by mouth every 6 (six) hours as needed.   Yes Historical Provider, MD   BP 165/74   Pulse 75  Temp(Src) 97.5 F (36.4 C) (Oral)  Resp 20  SpO2 98% Physical Exam  Nursing note and vitals reviewed. Constitutional: She is oriented to person, place, and time. No distress.  Elderly  HENT:  Head: Normocephalic and atraumatic.  Mucous membranes dry  Eyes: Pupils are equal, round, and reactive to light.  Neck: Neck supple.  Cardiovascular: Normal rate, regular rhythm and normal heart sounds.   No murmur heard. Pulmonary/Chest: Effort normal and breath sounds normal. No respiratory distress. She has no wheezes.  Abdominal: Soft. Bowel sounds are normal. She exhibits no mass. There is tenderness. There is no rebound and no guarding.  Mild distention, and diffuse tenderness to palpation without rebound or guarding  Musculoskeletal: She exhibits no edema.  Neurological: She is alert and oriented to person, place, and time.  Cranial nerves II through XII intact, no dysmetria to finger-nose-finger, 5 out of 5 strength in all 4 extremities, equal 2+ patellar reflexes, no clonus noted  Skin: Skin is warm and dry.  Psychiatric: She has a normal mood and affect.    ED Course  Procedures (including critical care time) Labs Review Labs Reviewed  COMPREHENSIVE METABOLIC PANEL - Abnormal; Notable for the following:    Glucose, Bld 139 (*)    AST 142 (*)    ALT 104 (*)    Alkaline Phosphatase 235 (*)    GFR calc non Af Amer 86 (*)    All other components within normal limits  URINALYSIS, ROUTINE W REFLEX MICROSCOPIC - Abnormal; Notable for the following:    Leukocytes, UA TRACE (*)    All other components within normal limits  CBC WITH DIFFERENTIAL  URINE MICROSCOPIC-ADD ON  Randolm Idol, ED  I-STAT CG4 LACTIC ACID, ED    Imaging Review Dg Cervical Spine Complete  07/29/2013   CLINICAL DATA:  Left neck pain  EXAM: CERVICAL SPINE - COMPLETE 4+ VIEW  COMPARISON:  None.  FINDINGS: Reversed cervical lordosis. Severe narrowing at C5-6 and C6-7 discs with posterior  osteophytic ridging. Prevertebral soft tissues are unremarkable. Foramina grossly patent. Odontoid is grossly intact.  IMPRESSION: No acute bony pathology.  Degenerative change.   Electronically Signed   By: Maryclare Bean M.D.   On: 07/29/2013 18:49   Dg Lumbar Spine Complete  07/29/2013   CLINICAL DATA:  Low back pain  EXAM: LUMBAR SPINE - COMPLETE 4+ VIEW  COMPARISON:  None.  FINDINGS: Osteopenia. No vertebral compression deformity. Moderate disc space narrowing at L1-2, L2-3, and L3-4. Facet arthropathy at L5-S1.  IMPRESSION: No acute bony pathology.  Chronic changes.   Electronically Signed   By: Maryclare Bean M.D.   On: 07/29/2013 18:47   Dg Abd 1 View  07/29/2013   CLINICAL DATA:  Bloating  EXAM: ABDOMEN - 1 VIEW  COMPARISON:  None.  FINDINGS: No disproportionate dilatation of small bowel. No obvious free intraperitoneal gas. Degenerative changes in the lumbar spine.  IMPRESSION: Nonobstructive bowel gas pattern.   Electronically Signed   By: Maryclare Bean M.D.   On: 07/29/2013 18:46   Ct Abdomen Pelvis W Contrast  07/29/2013   CLINICAL DATA:  Low back  pain and abdominal pain with nausea for the last 4 days. Headache, dizziness, numbness in the hands starting this morning.  EXAM: CT ABDOMEN AND PELVIS WITH CONTRAST  TECHNIQUE: Multidetector CT imaging of the abdomen and pelvis was performed using the standard protocol following bolus administration of intravenous contrast.  CONTRAST:  62mL OMNIPAQUE IOHEXOL 300 MG/ML  SOLN  COMPARISON:  DG ABD 1 VIEW dated 07/29/2013; DG LUMBAR SPINE COMPLETE dated 07/29/2013  FINDINGS: Small subpleural nodular opacities are demonstrated in the lung bases. This could represent inflammatory process although three-month follow-up is recommended to exclude persistent lesion. Surgical absence of the gallbladder. Moderate intra and extrahepatic bile duct dilatation down to the level of the ampulla. 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 Endoscopy recommended for further evaluation. No radiopaque common duct stones are visualized. No focal liver lesions are demonstrated. The pancreas, spleen, adrenal glands, abdominal aorta, inferior vena cava, and retroperitoneal lymph nodes are unremarkable. Parenchymal cyst in the right kidney and parapelvic cysts in both kidneys. No solid mass or hydronephrosis in the kidneys. The stomach, small bowel, and colon are not abnormally distended. No free air or free fluid in the abdomen. Abdominal wall musculature appears intact.  Pelvis: Appendix is normal. Uterus and ovaries are not enlarged. No free or loculated pelvic fluid collections. No inflammatory changes. No significant pelvic lymphadenopathy. Degenerative changes in the lumbar spine. Degenerative changes in the facet joints. Normal alignment. Disc bulge/ protrusions at L2-3, L3-4, and L4-5 appear to cause central canal narrowing.  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 Nonspecific subpleural nodular opacities in the lung bases. Degenerative changes in the lumbar spine with probable multilevel disc disease causing central canal narrowing.   Electronically Signed   By: Lucienne Capers M.D.   On: 07/29/2013 22:18     EKG Interpretation   Date/Time:  Thursday July 29 2013 15:39:15 EDT Ventricular Rate:  67 PR Interval:  140 QRS Duration: 82 QT Interval:  402 QTC Calculation: 424 R Axis:   65 Text Interpretation:  Normal sinus rhythm Early repolarization Normal ECG  Confirmed by HORTON  MD, Loma Sousa ([id number]) on 07/29/2013 4:23:59 PM      MDM   Final diagnoses:  Abdominal pain  Vertigo  Ampulla of Vater mass    Patient presents with multiple complaints. She is nontoxic on exam. Neurologic exam is nonfocal. Regarding patient's vertiginous symptoms, she has no evidence of cerebellar dysfunction. We'll treat as  peripheral vertigo with Valium and meclizine. Patient does have tenderness to palpation of the abdomen diffusely.  Lactate is normal. LFTs mildly elevated.  Plain films show nonobstructive bowel gas pattern. Plain films of lumbar spine are also within normal limits. Patient required morphine and Zofran. She reports improvement of her vertiginous symptoms. CT scan of the abdomen was obtained and is concerning for ampullary tumor. Patient has continued to have vomiting and pain. Will admit for pain control and EGD.  There are no lytic lesions of the spine noted on CT scan. If patient is found to have ampullary tumor, would consider MRI brain and MRI spine with concern for metastatic disease. At this time will defer further imaging given reassuring neurologic exam.    Merryl Hacker, MD 07/29/13 2351

## 2013-07-29 NOTE — ED Notes (Signed)
Patient transported to Ultrasound 

## 2013-07-29 NOTE — H&P (Signed)
[facility] History and Physical  Vicki Chavez WUJ:[id number] DOB: [birth year] DOA: 07/29/2013  Referring physician: ER physician PCP: Pcp Not In System   Chief Complaint: dizziness, abdominal pain, nausea and vomiting  HPI:  80 year old female with past medical history of hypertension, dyslpidemia, choledocholelithiasis and status post lap chole 03/16/2013 who presented to [facility] ED from UC due to complaints of feeling dizzy, weak and with ongoing nausea, vomiting and periumbilical pain for past few days prior to this admission. Pain is 10/10 in mid abdomen and not radiating. Her pain is intermittent and sharp and not relieved by laying down. She reported no blood in emesis. No fever or chills. NO chest pain and palpitations or shortness of breath. No reports of blood in stool. No diarrhea or constipation. She did endorse having low back pain which radiated to the left side of the leg. No complaints of joint swelling or falls. She also reported having headache which was associated with episode of vomiting and dizziness.  In ED, BP was 114/55, 196/75 and 165/74. HR was 63 Tmax was 97.6 F and oxygen saturation was 92% on room air. Blood work was unremarkable except for elevated LFT's AST 142, ALT 104 and ALP 235, normal bilirubin. Please note that her LFT's were chronically elevated and most recently in 03/2013 AST was 259, ALT 340 and ALP 417. CT abdomen was significant for possible tumor at the ampullary region. In addition, she continued to have dizziness and was given a dose of meclizine and her dizziness has improved. She was also given zofran and morphine.  Assessment and Plan:  Principal Problem:   Abdominal pain, nausea and vomiting - concern for possible ampullary tumor as a cause of abdominal pain, N/V; questionable ampullary mass seen on CT abdomen; obtain MRCP - no small bowel obstruction seen on abdominal xray - continue supportive care with IV fluids, antiemetics and analgesia -  asked ED physician to at least inform GI of this pt so they could see the pt in am for possible endoscopy and/or biopsy Active Problems:   HTN (hypertension) - cont home med but add hydralazine 10 mg every 8 hours sch for high BP; current SBP in 190's range   Dyslipidemia - hold statin to see if LFT's will improve   Vertigo, dizziness - unclear if vertigo or due to N/V and/or high BP - resolved at this time; pt did get a dose of meclizine in ED - no acute abnormalities on cervical spine x ray   Elevated LFTs - chronically elevated as indicated above actually better since 03/2013; concern is obviously for possible tumor involvement in ampullary region - obtain abdominal US and MRCP - hold statin therapy    Low back pain - no acute bony abnormalities on lumbar spine x ray - some disc bulging noticeable on CT scan which causes narrowing which likely contributes to patient's low back pain  Radiological Exams on Admission: Dg Cervical Spine Complete 07/29/2013     IMPRESSION: No acute bony pathology.  Degenerative change.     Dg Lumbar Spine Complete 07/29/2013   IMPRESSION: No acute bony pathology.  Chronic changes.     Dg Abd 1 View 07/29/2013   IMPRESSION: Nonobstructive bowel gas pattern.    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 Nonspecific subpleural nodular opacities in the lung bases. Degenerative changes in the lumbar spine with  probable multilevel disc disease causing central canal narrowing.      Code Status: Full Family Communication: Pt at bedside Disposition Plan: Admit for further evaluation  Robbie Lis, MD  [facility] Pager [phone]  Review of Systems:  Constitutional: Negative for fever, chills and malaise/fatigue. Negative for diaphoresis.  HENT: Negative for hearing loss, ear pain, nosebleeds, congestion, sore throat,  neck pain, tinnitus and ear discharge.   Eyes: Negative for blurred vision, double vision, photophobia, pain, discharge and redness.  Respiratory: Negative for cough, hemoptysis, sputum production, shortness of breath, wheezing and stridor.   Cardiovascular: Negative for chest pain, palpitations, orthopnea, claudication and leg swelling.  Gastrointestinal: Per HPI.  Genitourinary: Negative for dysuria, urgency, frequency, hematuria and flank pain.  Musculoskeletal: Negative for myalgias, positive for back pain, no joint pain and falls.  Skin: Negative for itching and rash.  Neurological: positive for dizziness and weakness. Negative for tingling, tremors, sensory change, speech change, focal weakness, loss of consciousness and headaches.  Endo/Heme/Allergies: Negative for environmental allergies and polydipsia. Does not bruise/bleed easily.  Psychiatric/Behavioral: Negative for suicidal ideas. The patient is not nervous/anxious.      Past Medical History  Diagnosis Date  . Hypertension   . High cholesterol   . Osteoporosis   . Prediabetes   . Gall stones    Past Surgical History  Procedure Laterality Date  . Ercp N/A 03/14/2013    Procedure: ENDOSCOPIC RETROGRADE CHOLANGIOPANCREATOGRAPHY (ERCP),stone removal, possible sphincterotomy;  Surgeon: Missy Sabins, MD;  Location: [place];  Service: Endoscopy;  Laterality: N/A;  . Cholecystectomy N/A 03/16/2013    Procedure: LAPAROSCOPIC CHOLECYSTECTOMY WITH INTRAOPERATIVE CHOLANGIOGRAM;  Surgeon: Earnstine Regal, MD;  Location: [place];  Service: General;  Laterality: N/A;   Social History:  reports that she has never smoked. She does not have any smokeless tobacco history on file. She reports that she does not drink alcohol or use illicit drugs.  Allergies  Allergen Reactions  . Penicillins     Does not remember    Family History: Family medical history significant for HTN, HLD   Prior to Admission medications   Medication Sig Start  Date End Date Taking? Authorizing Provider  alendronate (FOSAMAX) 70 MG tablet Take 70 mg by mouth once a week. wednesdays   Yes Historical Provider, MD  aspirin 81 MG tablet Take 81 mg by mouth daily.   Yes Historical Provider, MD  b complex vitamins tablet Take 1 tablet by mouth daily.   Yes Historical Provider, MD  calcium-vitamin D (OSCAL WITH D) 500-200 MG-UNIT per tablet Take 1 tablet by mouth 2 (two) times daily.   Yes Historical Provider, MD  Ferrous Sulfate (IRON) 325 (65 FE) MG TABS Take 1 tablet by mouth once a week. fridays   Yes Historical Provider, MD  moexipril-hydrochlorothiazide (UNIRETIC) 7.5-12.5 MG per tablet Take 1 tablet by mouth daily.   Yes Historical Provider, MD  rosuvastatin (CRESTOR) 5 MG tablet Take 5 mg by mouth at bedtime.    Yes Historical Provider, MD  traMADol-acetaminophen (ULTRACET) 37.5-325 MG per tablet Take 1 tablet by mouth every 6 (six) hours as needed.   Yes Historical Provider, MD   Physical Exam: Filed Vitals:   07/29/13 2039 07/29/13 2043 07/29/13 2100 07/29/13 2315  BP: 133/61 114/55 196/75 165/74  Pulse: 63 70 65 75  Temp:      TempSrc:      Resp:   20   SpO2:   100% 98%    Physical Exam  Constitutional: Appears ill, no acute distress HENT: Normocephalic. External right and left ear normal. Eyes: Conjunctivae and EOM are normal. PERRLA, no scleral icterus.  Neck: Normal ROM. Neck supple. No JVD. No tracheal deviation. No thyromegaly.  CVS: RRR, S1/S2 +, no murmurs, no gallops, no carotid bruit.  Pulmonary: Effort and breath sounds normal, no stridor, rhonchi, wheezes, rales.  Abdominal: Soft. BS +,  no distension, tenderness in periumbilical area, no rebound or guarding.  Musculoskeletal: Normal range of motion. No edema and no tenderness.  Lymphadenopathy: No lymphadenopathy noted, cervical, inguinal. Neuro: Alert. No focal neurologic deficits Skin: Skin is warm and dry. No rash noted. Not diaphoretic. Psychiatric: Normal mood and  affect. Behavior, judgment, thought content normal.   Labs on Admission:  Basic Metabolic Panel:  Recent Labs Lab 07/29/13 1534  NA 139  K 4.4  CL 103  CO2 23  GLUCOSE 139*  BUN 23  CREATININE 0.54  CALCIUM 9.1   Liver Function Tests:  Recent Labs Lab 07/29/13 1534  AST 142*  ALT 104*  ALKPHOS 235*  BILITOT 0.8  PROT 7.5  ALBUMIN 3.7   No results found for this basename: LIPASE, AMYLASE,  in the last 168 hours No results found for this basename: AMMONIA,  in the last 168 hours CBC:  Recent Labs Lab 07/29/13 1534  WBC 7.4  NEUTROABS 4.9  HGB 12.5  HCT 37.5  MCV 79.1  PLT 242   Cardiac Enzymes: No results found for this basename: CKTOTAL, CKMB, CKMBINDEX, TROPONINI,  in the last 168 hours BNP: No components found with this basename: POCBNP,  CBG: No results found for this basename: GLUCAP,  in the last 168 hours  If 7PM-7AM, please contact night-coverage [email or web address] Password [facility] 07/29/2013, 11:26 PM

## 2013-07-29 NOTE — ED Provider Notes (Signed)
Vicki Chavez is a 80 y.o. female who presents to Urgent Care today for headache vertigo back pain and vomiting.  Over the past 3 days patient has had low back pain. The pain worsened today and has become severe. It is associated with difficulty walking secondary to pain. She denies any injury. The pain radiates to the left hip. She denies any numbness or numbness in her extremities. She's tried Ultracet which seems to help some. She additionally has developed a intense headache associated with vertigo and vomiting recently. She additionally notes abdominal bloating and gas. She denies any significant abdominal pain. She denies any fevers or chills or diarrhea. She is having difficulty walking secondary to the pain and vertigo. She is having difficulty retaining fluids and solids because of vomiting.   Past Medical History  Diagnosis Date  . Hypertension   . High cholesterol   . Osteoporosis   . Prediabetes   . Gall stones    History  Substance Use Topics  . Smoking status: Never Smoker   . Smokeless tobacco: Not on file  . Alcohol Use: No   ROS as above Medications: No current facility-administered medications for this encounter.   Current Outpatient Prescriptions  Medication Sig Dispense Refill  . traMADol-acetaminophen (ULTRACET) 37.5-325 MG per tablet Take 1 tablet by mouth every 6 (six) hours as needed.      Marland Kitchen alendronate (FOSAMAX) 70 MG tablet Take 70 mg by mouth once a week. wednesdays      . aspirin 81 MG tablet Take 81 mg by mouth daily.      Marland Kitchen b complex vitamins tablet Take 1 tablet by mouth daily.      . calcium-vitamin D (OSCAL WITH D) 500-200 MG-UNIT per tablet Take 1 tablet by mouth 2 (two) times daily.      . Ferrous Sulfate (IRON) 325 (65 FE) MG TABS Take 1 tablet by mouth once a week. fridays      . moexipril-hydrochlorothiazide (UNIRETIC) 7.5-12.5 MG per tablet Take 1 tablet by mouth daily.      Marland Kitchen oxyCODONE (OXY IR/ROXICODONE) 5 MG immediate release tablet Take 1  tablet (5 mg total) by mouth every 4 (four) hours as needed for moderate pain.  30 tablet  0  . rosuvastatin (CRESTOR) 5 MG tablet Take 5 mg by mouth at bedtime.       . traMADol (ULTRAM) 50 MG tablet Take 0.5 tablets (25 mg total) by mouth every 6 (six) hours as needed for moderate pain.  30 tablet  0    Exam:  BP 159/64  Pulse 79  Temp(Src) 97.6 F (36.4 C) (Oral)  Resp 18  SpO2 100% Gen: uncomfortable-appearing elderly woman HEENT: EOMI,  MMM Lungs: Normal work of breathing. CTABL Heart: RRR no MRG Abd: NABS, Soft. NT, ND no masses palpated Exts: Brisk capillary refill, warm and well perfused.  Back: Nontender to spinal midline. Tender palpation left SI joint. Lower extremity reflexes are intact throughout and equal. Sensation is intact throughout bilateral lower extremities.  No results found for this or any previous visit (from the past 24 hour(s)). No results found.  Assessment and Plan: 80 y.o. female with  1) low back pain: Patient has severe low back pain. This is most likely related to myofascial disruption however compression fractures possibility. Doubtful for neurologic compromise based on limited physical exam today.. 2) headache vertigo and vomiting: Likely related. Unclear etiology. No injury. Neurologically intact otherwise. 3) abdominal bloating and gas: Unclear etiology.  Patient has multiple  severe medical problems at this time. Fundamentally the patient is having significant difficulty ambulating because of her pain and vertigo and inability to tolerate by mouth because of vertigo and vomiting.  The differential this time is quite broad. Plan to transfer to the emergency room for observation, evaluation and management.  Discussed warning signs or symptoms. Please see discharge instructions. Patient expresses understanding.    Gregor Hams, MD 07/29/13 [phone]

## 2013-07-30 ENCOUNTER — Encounter (HOSPITAL_COMMUNITY): Payer: Self-pay | Admitting: *Deleted

## 2013-07-30 ENCOUNTER — Encounter (HOSPITAL_COMMUNITY)
Admission: EM | Disposition: A | Discharge status: Home / Self Care | Payer: Self-pay | Source: Home / Self Care | Attending: Internal Medicine

## 2013-07-30 ENCOUNTER — Inpatient Hospital Stay (HOSPITAL_COMMUNITY): Payer: Medicaid - Out of State

## 2013-07-30 DIAGNOSIS — K831 Obstruction of bile duct: Secondary | ICD-10-CM

## 2013-07-30 DIAGNOSIS — R109 Unspecified abdominal pain: Secondary | ICD-10-CM

## 2013-07-30 DIAGNOSIS — R7989 Other specified abnormal findings of blood chemistry: Secondary | ICD-10-CM

## 2013-07-30 DIAGNOSIS — I10 Essential (primary) hypertension: Secondary | ICD-10-CM

## 2013-07-30 DIAGNOSIS — D490 Neoplasm of unspecified behavior of digestive system: Secondary | ICD-10-CM

## 2013-07-30 HISTORY — PX: ERCP: SHX5425

## 2013-07-30 HISTORY — PX: ESOPHAGOGASTRODUODENOSCOPY: SHX5428

## 2013-07-30 LAB — CBC
HCT: 36.4 % (ref 36.0–46.0)
Hemoglobin: 12 g/dL (ref 12.0–15.0)
MCH: 26.1 pg (ref 26.0–34.0)
MCHC: 33 g/dL (ref 30.0–36.0)
MCV: 79.1 fL (ref 78.0–100.0)
PLATELETS: 245 10*3/uL (ref 150–400)
RBC: 4.6 MIL/uL (ref 3.87–5.11)
RDW: 15.2 % (ref 11.5–15.5)
WBC: 6.2 10*3/uL (ref 4.0–10.5)

## 2013-07-30 LAB — COMPREHENSIVE METABOLIC PANEL
ALK PHOS: 229 U/L — AB (ref 39–117)
ALT: 131 U/L — AB (ref 0–35)
AST: 210 U/L — ABNORMAL HIGH (ref 0–37)
Albumin: 3.3 g/dL — ABNORMAL LOW (ref 3.5–5.2)
BILIRUBIN TOTAL: 1.1 mg/dL (ref 0.3–1.2)
BUN: 14 mg/dL (ref 6–23)
CHLORIDE: 103 meq/L (ref 96–112)
CO2: 24 meq/L (ref 19–32)
Calcium: 8.6 mg/dL (ref 8.4–10.5)
Creatinine, Ser: 0.52 mg/dL (ref 0.50–1.10)
GFR calc Af Amer: >90 mL/min (ref >90)
GFR, EST NON AFRICAN AMERICAN: 87 mL/min — AB (ref >90)
Glucose, Bld: 103 mg/dL — ABNORMAL HIGH (ref 70–99)
Potassium: 3.7 mEq/L (ref 3.7–5.3)
SODIUM: 139 meq/L (ref 137–147)
Total Protein: 6.7 g/dL (ref 6.0–8.3)

## 2013-07-30 LAB — GLUCOSE, CAPILLARY: Glucose-Capillary: 96 mg/dL (ref 70–99)

## 2013-07-30 LAB — TSH: TSH: 0.719 u[IU]/mL (ref 0.350–4.500)

## 2013-07-30 SURGERY — EGD (ESOPHAGOGASTRODUODENOSCOPY)
Anesthesia: Moderate Sedation

## 2013-07-30 SURGERY — ERCP, WITH INTERVENTION IF INDICATED
Anesthesia: Moderate Sedation

## 2013-07-30 MED ORDER — CALCIUM CARBONATE-VITAMIN D 500-200 MG-UNIT PO TABS
1.0000 | ORAL_TABLET | Freq: Two times a day (BID) | ORAL | Status: DC
  Administered 2013-07-30 – 2013-08-02 (×7): 1 via ORAL
  Filled 2013-07-30 (×9): qty 1

## 2013-07-30 MED ORDER — FENTANYL CITRATE 0.05 MG/ML IJ SOLN
INTRAMUSCULAR | Status: AC
  Filled 2013-07-30: qty 2

## 2013-07-30 MED ORDER — DIPHENHYDRAMINE HCL 50 MG/ML IJ SOLN
INTRAMUSCULAR | Status: AC
  Filled 2013-07-30: qty 1

## 2013-07-30 MED ORDER — HYDROMORPHONE HCL PF 1 MG/ML IJ SOLN
1.0000 mg | INTRAMUSCULAR | Status: DC | PRN
  Administered 2013-07-30: 1 mg via INTRAVENOUS
  Filled 2013-07-30: qty 1

## 2013-07-30 MED ORDER — ACETAMINOPHEN 325 MG PO TABS
650.0000 mg | ORAL_TABLET | Freq: Four times a day (QID) | ORAL | Status: DC | PRN
  Administered 2013-07-31: 650 mg via ORAL
  Filled 2013-07-30: qty 2

## 2013-07-30 MED ORDER — ASPIRIN 81 MG PO CHEW
81.0000 mg | CHEWABLE_TABLET | Freq: Every day | ORAL | Status: DC
  Administered 2013-07-31 – 2013-08-02 (×3): 81 mg via ORAL
  Filled 2013-07-30 (×4): qty 1

## 2013-07-30 MED ORDER — SODIUM CHLORIDE 0.9 % IV SOLN
INTRAVENOUS | Status: DC
  Administered 2013-07-30: 20 mL/h via INTRAVENOUS

## 2013-07-30 MED ORDER — MOEXIPRIL-HYDROCHLOROTHIAZIDE 7.5-12.5 MG PO TABS: 1.0000 | ORAL_TABLET | Freq: Every day | ORAL | Status: DC

## 2013-07-30 MED ORDER — SODIUM CHLORIDE 0.9 % IV SOLN
INTRAVENOUS | Status: AC
  Administered 2013-07-30: 01:00:00 via INTRAVENOUS

## 2013-07-30 MED ORDER — FENTANYL CITRATE 0.05 MG/ML IJ SOLN
INTRAMUSCULAR | Status: DC | PRN
  Administered 2013-07-30: 12.5 ug via INTRAVENOUS
  Administered 2013-07-30: 25 ug via INTRAVENOUS
  Administered 2013-07-30: 12.5 ug via INTRAVENOUS

## 2013-07-30 MED ORDER — MIDAZOLAM HCL 10 MG/2ML IJ SOLN
INTRAMUSCULAR | Status: DC | PRN
  Administered 2013-07-30: 2 mg via INTRAVENOUS
  Administered 2013-07-30 (×2): 1 mg via INTRAVENOUS

## 2013-07-30 MED ORDER — MIDAZOLAM HCL 5 MG/ML IJ SOLN
INTRAMUSCULAR | Status: AC
  Filled 2013-07-30: qty 2

## 2013-07-30 MED ORDER — ONDANSETRON HCL 4 MG PO TABS
4.0000 mg | ORAL_TABLET | Freq: Four times a day (QID) | ORAL | Status: DC | PRN
  Administered 2013-08-01: 4 mg via ORAL
  Filled 2013-07-30: qty 1

## 2013-07-30 MED ORDER — SODIUM CHLORIDE 0.9 % IV SOLN: INTRAVENOUS | Status: DC

## 2013-07-30 MED ORDER — MIDAZOLAM HCL 10 MG/2ML IJ SOLN
INTRAMUSCULAR | Status: DC | PRN
  Administered 2013-07-30 (×2): 1 mg via INTRAVENOUS
  Administered 2013-07-30: 2 mg via INTRAVENOUS

## 2013-07-30 MED ORDER — ONDANSETRON HCL 4 MG/2ML IJ SOLN: 4.0000 mg | Freq: Four times a day (QID) | INTRAMUSCULAR | Status: DC | PRN

## 2013-07-30 MED ORDER — HYDROCODONE-ACETAMINOPHEN 5-325 MG PO TABS
1.0000 | ORAL_TABLET | ORAL | Status: DC | PRN
  Administered 2013-07-30: 1 via ORAL
  Administered 2013-07-30: 2 via ORAL
  Administered 2013-07-31 – 2013-08-01 (×2): 1 via ORAL
  Filled 2013-07-30 (×3): qty 1
  Filled 2013-07-30: qty 2

## 2013-07-30 MED ORDER — FENTANYL CITRATE 0.05 MG/ML IJ SOLN
INTRAMUSCULAR | Status: DC | PRN
  Administered 2013-07-30: 12.5 ug via INTRAVENOUS
  Administered 2013-07-30: 25 ug via INTRAVENOUS

## 2013-07-30 MED ORDER — ACETAMINOPHEN 650 MG RE SUPP: 650.0000 mg | Freq: Four times a day (QID) | RECTAL | Status: DC | PRN

## 2013-07-30 MED ORDER — SODIUM CHLORIDE 0.9 % IV SOLN
INTRAVENOUS | Status: DC | PRN
  Administered 2013-07-30: 14:00:00

## 2013-07-30 MED ORDER — HYDROCHLOROTHIAZIDE 12.5 MG PO CAPS
12.5000 mg | ORAL_CAPSULE | Freq: Every day | ORAL | Status: DC
  Administered 2013-07-31 – 2013-08-02 (×3): 12.5 mg via ORAL
  Filled 2013-07-30 (×4): qty 1

## 2013-07-30 MED ORDER — TRANDOLAPRIL 1 MG PO TABS
1.0000 mg | ORAL_TABLET | Freq: Every day | ORAL | Status: DC
  Administered 2013-07-31 – 2013-08-02 (×3): 1 mg via ORAL
  Filled 2013-07-30 (×4): qty 1

## 2013-07-30 SURGICAL SUPPLY — 1 items: Plastic Biliary Stent (Stent) ×2 IMPLANT

## 2013-07-30 NOTE — Progress Notes (Signed)
TC from pt's son to talk to mother regarding how she is doing.  With pt's permission this nurse talked with son regarding elevated blood pressure, that pt is not currently having any nausea or vomiting.  Pt's son stated as long as his mother's pain is monitored he will stay at home and come in early am to be here when GI physician comes to see pt.

## 2013-07-30 NOTE — Consult Note (Signed)
Reason for Consult: obstructing ampullary mass Referring Physician: Dr. Carol Ada   HPI: Vicki Chavez is a 80 year old female with a history of HTN, HLD, choledocholithiasis s/p lap chole 03/16/13 by Dr. Harlow Asa and ERCP by Dr. Amedeo Plenty which revealed a "somewhat enlarged" CBD who presented to [facility] yesterday with abdominal pain, back pain, dizziness and nausea which suddenly began yesterday.  Prior to onset of symptoms she was in her usual state of health.  Location of the pain is right back, ruq and epigastric region.  Time pattern is constant.  Severe in severity.  She denies melena, hematochezia, weight loss. Denies sob, chest pains or palpitations.  She has had elevated LFTs for some time.  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  We have been asked to evaluate the patient for this reason.  At present time, she denies nausea or abdominal pain.  Most of her history was provided by her son.  The patient is from [place], but plans to stay in the area.   No previous cardiac history.  She is otherwise in relatively good health.  Mobilizes quite well according to her son.     Past Medical History  Diagnosis Date  . Hypertension   . High cholesterol   . Osteoporosis   . Prediabetes   . Gall stones     Past Surgical History  Procedure Laterality Date  . Ercp N/A 03/14/2013    Procedure: ENDOSCOPIC RETROGRADE CHOLANGIOPANCREATOGRAPHY (ERCP),stone removal, possible sphincterotomy;  Surgeon: Missy Sabins, MD;  Location: [facility];  Service: Endoscopy;  Laterality: N/A;  . Cholecystectomy N/A 03/16/2013    Procedure: LAPAROSCOPIC CHOLECYSTECTOMY WITH INTRAOPERATIVE CHOLANGIOGRAM;  Surgeon: Earnstine Regal, MD;  Location: [facility];  Service: General;  Laterality: N/A;    History reviewed. No pertinent family history.  Social History:   reports that she has never smoked. She does not have any smokeless tobacco history on file. She reports that she does not drink alcohol or use illicit drugs.  Allergies:  Allergies  Allergen Reactions  . Penicillins     Does not remember    Medications:  Scheduled Meds: . aspirin  81 mg Oral Daily  . calcium-vitamin D  1 tablet Oral BID WC  . hydrALAZINE  10 mg Intravenous 3 times per day  . trandolapril  1 mg Oral Daily   And  . hydrochlorothiazide  12.5 mg Oral Daily  .  morphine injection  4 mg Intravenous Once   Continuous Infusions:  PRN Meds:.acetaminophen, acetaminophen, HYDROcodone-acetaminophen, HYDROmorphone (DILAUDID) injection, ondansetron (ZOFRAN) IV, ondansetron  Results for orders placed during the hospital encounter of 07/29/13 (from the past 48 hour(s))  CBC WITH DIFFERENTIAL     Status: None   Collection Time    07/29/13  3:34 PM      Result Value Ref Range   WBC 7.4  4.0 - 10.5 K/uL   RBC 4.74  3.87 - 5.11 MIL/uL   Hemoglobin 12.5  12.0 - 15.0 g/dL   HCT 37.5  36.0 - 46.0 %   MCV 79.1  78.0 - 100.0 fL   MCH 26.4  26.0 - 34.0 pg   MCHC 33.3  30.0 - 36.0 g/dL   RDW 15.1  11.5 - 15.5 %   Platelets 242  150 - 400 K/uL   Neutrophils Relative % 65  43 - 77 %   Neutro Abs 4.9  1.7 - 7.7 K/uL   Lymphocytes Relative 26  12 - 46 %   Lymphs Abs 1.9  0.7 - 4.0 K/uL   Monocytes Relative 7  3 - 12 %   Monocytes Absolute 0.5  0.1 - 1.0 K/uL   Eosinophils Relative 2  0 - 5 %   Eosinophils Absolute 0.1  0.0 - 0.7 K/uL   Basophils Relative 0  0 - 1 %   Basophils Absolute 0.0  0.0 - 0.1 K/uL  COMPREHENSIVE METABOLIC PANEL     Status: Abnormal   Collection Time    07/29/13  3:34 PM      Result Value Ref Range   Sodium 139  137 - 147 mEq/L   Potassium 4.4  3.7 - 5.3 mEq/L   Chloride 103  96 - 112 mEq/L   CO2 23  19 - 32 mEq/L   Glucose, Bld 139 (*) 70 - 99 mg/dL   BUN 23  6 - 23 mg/dL   Creatinine, Ser 0.54  0.50 - 1.10 mg/dL   Calcium 9.1  8.4 - 10.5 mg/dL    Total Protein 7.5  6.0 - 8.3 g/dL   Albumin 3.7  3.5 - 5.2 g/dL   AST 142 (*) 0 - 37 U/L   ALT 104 (*) 0 - 35 U/L   Alkaline Phosphatase 235 (*) 39 - 117 U/L   Total Bilirubin 0.8  0.3 - 1.2 mg/dL   GFR calc non Af Amer 86 (*) >90 mL/min   GFR calc Af Amer >90  >90 mL/min   Comment: (NOTE)     The eGFR has been calculated using the CKD EPI equation.     This calculation has not been validated in all clinical situations.     eGFR's persistently <90 mL/min signify possible Chronic Kidney     Disease.  Randolm Idol, ED     Status: None   Collection Time    07/29/13  3:49 PM      Result Value Ref Range   Troponin i, poc 0.01  0.00 - 0.08 ng/mL   Comment 3            Comment: Due to the release kinetics of cTnI,     a negative result within the first hours     of the onset of symptoms does not rule out     myocardial infarction with certainty.     If myocardial infarction is still suspected,     repeat the test at appropriate intervals.  I-STAT CG4 LACTIC ACID, ED     Status: None   Collection Time    07/29/13  5:45 PM      Result Value Ref Range   Lactic Acid, Venous 0.68  0.5 - 2.2 mmol/L  URINALYSIS, ROUTINE W REFLEX MICROSCOPIC     Status: Abnormal   Collection Time    07/29/13  8:23 PM      Result Value Ref Range   Color, Urine YELLOW  YELLOW   APPearance CLEAR  CLEAR   Specific Gravity, Urine 1.008  1.005 - 1.030   pH 7.5  5.0 - 8.0   Glucose, UA NEGATIVE  NEGATIVE mg/dL   Hgb urine dipstick NEGATIVE  NEGATIVE   Bilirubin Urine NEGATIVE  NEGATIVE   Ketones, ur NEGATIVE  NEGATIVE mg/dL   Protein, ur NEGATIVE  NEGATIVE mg/dL   Urobilinogen, UA 0.2  0.0 - 1.0 mg/dL   Nitrite  NEGATIVE  NEGATIVE   Leukocytes, UA TRACE (*) NEGATIVE  URINE MICROSCOPIC-ADD ON     Status: None   Collection Time    07/29/13  8:23 PM      Result Value Ref Range   Squamous Epithelial / LPF RARE  RARE   WBC, UA 0-2  <3 WBC/hpf   RBC / HPF 0-2  <3 RBC/hpf  COMPREHENSIVE METABOLIC PANEL      Status: Abnormal   Collection Time    07/30/13  1:53 AM      Result Value Ref Range   Sodium 139  137 - 147 mEq/L   Potassium 3.7  3.7 - 5.3 mEq/L   Chloride 103  96 - 112 mEq/L   CO2 24  19 - 32 mEq/L   Glucose, Bld 103 (*) 70 - 99 mg/dL   BUN 14  6 - 23 mg/dL   Creatinine, Ser 0.52  0.50 - 1.10 mg/dL   Calcium 8.6  8.4 - 10.5 mg/dL   Total Protein 6.7  6.0 - 8.3 g/dL   Albumin 3.3 (*) 3.5 - 5.2 g/dL   AST 210 (*) 0 - 37 U/L   ALT 131 (*) 0 - 35 U/L   Alkaline Phosphatase 229 (*) 39 - 117 U/L   Total Bilirubin 1.1  0.3 - 1.2 mg/dL   GFR calc non Af Amer 87 (*) >90 mL/min   GFR calc Af Amer >90  >90 mL/min   Comment: (NOTE)     The eGFR has been calculated using the CKD EPI equation.     This calculation has not been validated in all clinical situations.     eGFR's persistently <90 mL/min signify possible Chronic Kidney     Disease.  CBC     Status: None   Collection Time    07/30/13  1:53 AM      Result Value Ref Range   WBC 6.2  4.0 - 10.5 K/uL   RBC 4.60  3.87 - 5.11 MIL/uL   Hemoglobin 12.0  12.0 - 15.0 g/dL   HCT 36.4  36.0 - 46.0 %   MCV 79.1  78.0 - 100.0 fL   MCH 26.1  26.0 - 34.0 pg   MCHC 33.0  30.0 - 36.0 g/dL   RDW 15.2  11.5 - 15.5 %   Platelets 245  150 - 400 K/uL  TSH     Status: None   Collection Time    07/30/13  1:53 AM      Result Value Ref Range   TSH 0.719  0.350 - 4.500 uIU/mL   Comment: Please note change in reference range.  GLUCOSE, CAPILLARY     Status: None   Collection Time    07/30/13  8:00 AM      Result Value Ref Range   Glucose-Capillary 96  70 - 99 mg/dL    Dg Cervical Spine Complete  07/29/2013   CLINICAL DATA:  Left neck pain  EXAM: CERVICAL SPINE - COMPLETE 4+ VIEW  COMPARISON:  None.  FINDINGS: Reversed cervical lordosis. Severe narrowing at C5-6 and C6-7 discs with posterior osteophytic ridging. Prevertebral soft tissues are unremarkable. Foramina grossly patent. Odontoid is grossly intact.  IMPRESSION: No acute bony pathology.   Degenerative change.   Electronically Signed   By: Maryclare Bean M.D.   On: 07/29/2013 18:49   Dg Lumbar Spine Complete  07/29/2013   CLINICAL DATA:  Low back pain  EXAM: LUMBAR SPINE - COMPLETE 4+ VIEW  COMPARISON:  None.  FINDINGS: Osteopenia. No vertebral compression deformity. Moderate disc space narrowing at L1-2, L2-3, and L3-4. Facet arthropathy at L5-S1.  IMPRESSION: No acute bony pathology.  Chronic changes.   Electronically Signed   By: Maryclare Bean M.D.   On: 07/29/2013 18:47   Dg Abd 1 View  07/29/2013   CLINICAL DATA:  Bloating  EXAM: ABDOMEN - 1 VIEW  COMPARISON:  None.  FINDINGS: No disproportionate dilatation of small bowel. No obvious free intraperitoneal gas. Degenerative changes in the lumbar spine.  IMPRESSION: Nonobstructive bowel gas pattern.   Electronically Signed   By: Maryclare Bean M.D.   On: 07/29/2013 18:46   US Abdomen Complete  07/30/2013   CLINICAL DATA:  Abnormal liver function studies.  EXAM: ULTRASOUND ABDOMEN COMPLETE  COMPARISON:  CT ABD/PELVIS W CM dated 07/29/2013; DG ABD 1 VIEW dated 07/29/2013; DG CHOLANGIOGRAM OPERATIVE dated 03/16/2013  FINDINGS: Gallbladder:  Surgical absence of the gallbladder.  Common bile duct:  Diameter: 16 mm, dilated. No intraluminal filling defects demonstrated.  Liver:  No focal lesion identified. Within normal limits in parenchymal echogenicity.  IVC:  No abnormality visualized.  Pancreas:  Not visualized due to overlying bowel gas.  Spleen:  Size and appearance within normal limits.  Right Kidney:  Length: 9.7 cm. Cyst in the lower pole measuring 2.3 cm maximal diameter. This was also demonstrated on the previous CT scan.  Left Kidney:  Length: 10.2 cm. Mild prominence of collecting system. No parenchymal mass or atrophy.  Abdominal aorta:  No aneurysm visualized.  Other findings:  None.  IMPRESSION: Previous cholecystitis. Bile duct dilatation as demonstrated on previous CT.   Electronically Signed   By: Lucienne Capers M.D.   On: 07/30/2013 00:44    Ct Abdomen Pelvis W Contrast  07/29/2013   CLINICAL DATA:  Low back pain and abdominal pain with nausea for the last 4 days. Headache, dizziness, numbness in the hands starting this morning.  EXAM: CT ABDOMEN AND PELVIS WITH CONTRAST  TECHNIQUE: Multidetector CT imaging of the abdomen and pelvis was performed using the standard protocol following bolus administration of intravenous contrast.  CONTRAST:  79m OMNIPAQUE IOHEXOL 300 MG/ML  SOLN  COMPARISON:  DG ABD 1 VIEW dated 07/29/2013; DG LUMBAR SPINE COMPLETE dated 07/29/2013  FINDINGS: Small subpleural nodular opacities are demonstrated in the lung bases. This could represent inflammatory process although three-month follow-up is recommended to exclude persistent lesion. Surgical absence of the gallbladder. Moderate intra and extrahepatic bile duct dilatation down to the level of the ampulla. 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 Endoscopy recommended for further evaluation. No radiopaque common duct stones are visualized. No focal liver lesions are demonstrated. The pancreas, spleen, adrenal glands, abdominal aorta, inferior vena cava, and retroperitoneal lymph nodes are unremarkable. Parenchymal cyst in the right kidney and parapelvic cysts in both kidneys. No solid mass or hydronephrosis in the kidneys. The stomach, small bowel, and colon are not abnormally distended. No free air or free fluid in the abdomen. Abdominal wall musculature appears intact.  Pelvis: Appendix is normal. Uterus and ovaries are not enlarged. No free or loculated pelvic fluid collections. No inflammatory changes. No significant pelvic lymphadenopathy. Degenerative changes in the lumbar spine. Degenerative changes in the facet joints. Normal alignment. Disc bulge/ protrusions at L2-3, L3-4, and L4-5 appear to cause central canal narrowing.  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 Nonspecific subpleural nodular opacities in the lung bases. Degenerative changes in the lumbar spine with probable multilevel disc disease causing central canal narrowing.   Electronically Signed   By: Lucienne Capers M.D.   On: 07/29/2013 22:18   Dg Ercp  07/30/2013   CLINICAL DATA:  Ampullary mass  EXAM: ERCP  TECHNIQUE: Multiple spot images obtained with the fluoroscopic device and submitted for interpretation post-procedure.  COMPARISON:  None.  FINDINGS: Three spot films were obtained during an ERCP. The initial image show some contrast within the common bile duct which is dilated. A plastic stent was then placed across the obstructive change. 50 seconds of fluoroscopy was utilized.   Electronically Signed   By: Inez Catalina M.D.   On: 07/30/2013 14:08    Review of Systems  Constitutional: Positive for malaise/fatigue. Negative for fever, chills, weight loss and diaphoresis.  Eyes: Negative for blurred vision, double vision and photophobia.  Respiratory: Negative for hemoptysis and shortness of breath.   Cardiovascular: Negative for chest pain, palpitations and leg swelling.  Gastrointestinal: Negative for nausea, vomiting, abdominal pain, diarrhea, constipation, blood in stool and melena.  Genitourinary: Negative for dysuria and urgency.  Musculoskeletal: Positive for back pain. Negative for myalgias and neck pain.  Neurological: Negative for dizziness, tingling, tremors, seizures, loss of consciousness, weakness and headaches.   Blood pressure 105/59, pulse 73, temperature 97.6 F (36.4 C), temperature source Oral, resp. rate 16, height _0  (1.473 m), weight 114 lb 6.7 oz (51.9 kg), SpO2 100.00%. Physical Exam  Constitutional: She is oriented to person, place, and time. She appears well-developed and well-nourished. No distress.  HENT:  Head: Normocephalic and atraumatic.  Mouth/Throat: No oropharyngeal exudate.   Cardiovascular: Normal rate, regular rhythm, normal heart sounds and intact distal pulses.  Exam reveals no gallop and no friction rub.   No murmur heard. Respiratory: Effort normal and breath sounds normal. No respiratory distress. She has no wheezes. She has no rales. She exhibits no tenderness.  GI: Soft. Bowel sounds are normal. She exhibits no distension and no mass. There is no tenderness. There is no rebound and no guarding.  Musculoskeletal: Normal range of motion. She exhibits no edema and no tenderness.  Neurological: She is alert and oriented to person, place, and time.  Skin: Skin is warm and dry. No rash noted. She is not diaphoretic. No erythema. No pallor.  Psychiatric: She has a normal mood and affect. Her behavior is normal. Judgment and thought content normal.    Assessment/Plan: Obstructing ampullary mass S/p ERCP with stent placement -await biopsy results -advance diet as tolerated -she will likely require a whipple in the near future, but will wait for the final pathology results.  I will notify Dr. Barry Dienes and arrange an outpatient follow up.   -surgery will follow  Falynn Ailey ANP-BC 07/30/2013, 3:37 PM

## 2013-07-30 NOTE — Consult Note (Signed)
Reason for Consult: Abnormal CT scan Referring Physician: [facility]  Vicki Chavez HPI:  This is an 80 year old female who presents with complaints of fatigue and dizziness.  Further evaluation revealed that her liver enzymes were elevated in an obstructive patten.  A CT scan was performed, which revealed dilated intrahepatic and extrahepatic ducts.  In 12/20145 she was admitted for RUQ pain.  No CT scan was performed at that time, but she was thought to have choledocholithiasis.  An ERCP was performed by Dr. Amedeo Plenty on 03/14/2013 for Dr. Olevia Perches and there was the possibility of a large stone, however, it was never grossly visualized.  She subsequently underwent a lap chole and she was discharged home.  Dr. Olevia Perches obtained records from her time in [place] and her liver enzymes were normal in 11/2012.  She new presents with the above symptoms and a persistently abnormal liver panel.  Past Medical History  Diagnosis Date  . Hypertension   . High cholesterol   . Osteoporosis   . Prediabetes   . Gall stones     Past Surgical History  Procedure Laterality Date  . Ercp N/A 03/14/2013    Procedure: ENDOSCOPIC RETROGRADE CHOLANGIOPANCREATOGRAPHY (ERCP),stone removal, possible sphincterotomy;  Surgeon: Missy Sabins, MD;  Location: [place];  Service: Endoscopy;  Laterality: N/A;  . Cholecystectomy N/A 03/16/2013    Procedure: LAPAROSCOPIC CHOLECYSTECTOMY WITH INTRAOPERATIVE CHOLANGIOGRAM;  Surgeon: Earnstine Regal, MD;  Location: [place];  Service: General;  Laterality: N/A;    No family history on file.  Social History:  reports that she has never smoked. She does not have any smokeless tobacco history on file. She reports that she does not drink alcohol or use illicit drugs.  Allergies:  Allergies  Allergen Reactions  . Penicillins     Does not remember    Medications:  Scheduled: . aspirin  81 mg Oral Daily  . calcium-vitamin D  1 tablet Oral BID WC  . hydrALAZINE  10 mg Intravenous  3 times per day  . trandolapril  1 mg Oral Daily   And  . hydrochlorothiazide  12.5 mg Oral Daily  .  morphine injection  4 mg Intravenous Once   Continuous: . sodium chloride    . sodium chloride      Results for orders placed during the hospital encounter of 07/29/13 (from the past 24 hour(s))  CBC WITH DIFFERENTIAL     Status: None   Collection Time    07/29/13  3:34 PM      Result Value Ref Range   WBC 7.4  4.0 - 10.5 K/uL   RBC 4.74  3.87 - 5.11 MIL/uL   Hemoglobin 12.5  12.0 - 15.0 g/dL   HCT 37.5  36.0 - 46.0 %   MCV 79.1  78.0 - 100.0 fL   MCH 26.4  26.0 - 34.0 pg   MCHC 33.3  30.0 - 36.0 g/dL   RDW 15.1  11.5 - 15.5 %   Platelets 242  150 - 400 K/uL   Neutrophils Relative % 65  43 - 77 %   Neutro Abs 4.9  1.7 - 7.7 K/uL   Lymphocytes Relative 26  12 - 46 %   Lymphs Abs 1.9  0.7 - 4.0 K/uL   Monocytes Relative 7  3 - 12 %   Monocytes Absolute 0.5  0.1 - 1.0 K/uL   Eosinophils Relative 2  0 - 5 %   Eosinophils Absolute 0.1  0.0 - 0.7 K/uL  Basophils Relative 0  0 - 1 %   Basophils Absolute 0.0  0.0 - 0.1 K/uL  COMPREHENSIVE METABOLIC PANEL     Status: Abnormal   Collection Time    07/29/13  3:34 PM      Result Value Ref Range   Sodium 139  137 - 147 mEq/L   Potassium 4.4  3.7 - 5.3 mEq/L   Chloride 103  96 - 112 mEq/L   CO2 23  19 - 32 mEq/L   Glucose, Bld 139 (*) 70 - 99 mg/dL   BUN 23  6 - 23 mg/dL   Creatinine, Ser 0.54  0.50 - 1.10 mg/dL   Calcium 9.1  8.4 - 10.5 mg/dL   Total Protein 7.5  6.0 - 8.3 g/dL   Albumin 3.7  3.5 - 5.2 g/dL   AST 142 (*) 0 - 37 U/L   ALT 104 (*) 0 - 35 U/L   Alkaline Phosphatase 235 (*) 39 - 117 U/L   Total Bilirubin 0.8  0.3 - 1.2 mg/dL   GFR calc non Af Amer 86 (*) >90 mL/min   GFR calc Af Amer >90  >90 mL/min  I-STAT TROPOININ, ED     Status: None   Collection Time    07/29/13  3:49 PM      Result Value Ref Range   Troponin i, poc 0.01  0.00 - 0.08 ng/mL   Comment 3           I-STAT CG4 LACTIC ACID, ED     Status:  None   Collection Time    07/29/13  5:45 PM      Result Value Ref Range   Lactic Acid, Venous 0.68  0.5 - 2.2 mmol/L  URINALYSIS, ROUTINE W REFLEX MICROSCOPIC     Status: Abnormal   Collection Time    07/29/13  8:23 PM      Result Value Ref Range   Color, Urine YELLOW  YELLOW   APPearance CLEAR  CLEAR   Specific Gravity, Urine 1.008  1.005 - 1.030   pH 7.5  5.0 - 8.0   Glucose, UA NEGATIVE  NEGATIVE mg/dL   Hgb urine dipstick NEGATIVE  NEGATIVE   Bilirubin Urine NEGATIVE  NEGATIVE   Ketones, ur NEGATIVE  NEGATIVE mg/dL   Protein, ur NEGATIVE  NEGATIVE mg/dL   Urobilinogen, UA 0.2  0.0 - 1.0 mg/dL   Nitrite NEGATIVE  NEGATIVE   Leukocytes, UA TRACE (*) NEGATIVE  URINE MICROSCOPIC-ADD ON     Status: None   Collection Time    07/29/13  8:23 PM      Result Value Ref Range   Squamous Epithelial / LPF RARE  RARE   WBC, UA 0-2  <3 WBC/hpf   RBC / HPF 0-2  <3 RBC/hpf  COMPREHENSIVE METABOLIC PANEL     Status: Abnormal   Collection Time    07/30/13  1:53 AM      Result Value Ref Range   Sodium 139  137 - 147 mEq/L   Potassium 3.7  3.7 - 5.3 mEq/L   Chloride 103  96 - 112 mEq/L   CO2 24  19 - 32 mEq/L   Glucose, Bld 103 (*) 70 - 99 mg/dL   BUN 14  6 - 23 mg/dL   Creatinine, Ser 0.52  0.50 - 1.10 mg/dL   Calcium 8.6  8.4 - 10.5 mg/dL   Total Protein 6.7  6.0 - 8.3 g/dL   Albumin 3.3 (*) 3.5 -  5.2 g/dL   AST 210 (*) 0 - 37 U/L   ALT 131 (*) 0 - 35 U/L   Alkaline Phosphatase 229 (*) 39 - 117 U/L   Total Bilirubin 1.1  0.3 - 1.2 mg/dL   GFR calc non Af Amer 87 (*) >90 mL/min   GFR calc Af Amer >90  >90 mL/min  CBC     Status: None   Collection Time    07/30/13  1:53 AM      Result Value Ref Range   WBC 6.2  4.0 - 10.5 K/uL   RBC 4.60  3.87 - 5.11 MIL/uL   Hemoglobin 12.0  12.0 - 15.0 g/dL   HCT 36.4  36.0 - 46.0 %   MCV 79.1  78.0 - 100.0 fL   MCH 26.1  26.0 - 34.0 pg   MCHC 33.0  30.0 - 36.0 g/dL   RDW 15.2  11.5 - 15.5 %   Platelets 245  150 - 400 K/uL  TSH      Status: None   Collection Time    07/30/13  1:53 AM      Result Value Ref Range   TSH 0.719  0.350 - 4.500 uIU/mL     Dg Cervical Spine Complete  07/29/2013   CLINICAL DATA:  Left neck pain  EXAM: CERVICAL SPINE - COMPLETE 4+ VIEW  COMPARISON:  None.  FINDINGS: Reversed cervical lordosis. Severe narrowing at C5-6 and C6-7 discs with posterior osteophytic ridging. Prevertebral soft tissues are unremarkable. Foramina grossly patent. Odontoid is grossly intact.  IMPRESSION: No acute bony pathology.  Degenerative change.   Electronically Signed   By: Maryclare Bean M.D.   On: 07/29/2013 18:49   Dg Lumbar Spine Complete  07/29/2013   CLINICAL DATA:  Low back pain  EXAM: LUMBAR SPINE - COMPLETE 4+ VIEW  COMPARISON:  None.  FINDINGS: Osteopenia. No vertebral compression deformity. Moderate disc space narrowing at L1-2, L2-3, and L3-4. Facet arthropathy at L5-S1.  IMPRESSION: No acute bony pathology.  Chronic changes.   Electronically Signed   By: Maryclare Bean M.D.   On: 07/29/2013 18:47   Dg Abd 1 View  07/29/2013   CLINICAL DATA:  Bloating  EXAM: ABDOMEN - 1 VIEW  COMPARISON:  None.  FINDINGS: No disproportionate dilatation of small bowel. No obvious free intraperitoneal gas. Degenerative changes in the lumbar spine.  IMPRESSION: Nonobstructive bowel gas pattern.   Electronically Signed   By: Maryclare Bean M.D.   On: 07/29/2013 18:46   US Abdomen Complete  07/30/2013   CLINICAL DATA:  Abnormal liver function studies.  EXAM: ULTRASOUND ABDOMEN COMPLETE  COMPARISON:  CT ABD/PELVIS W CM dated 07/29/2013; DG ABD 1 VIEW dated 07/29/2013; DG CHOLANGIOGRAM OPERATIVE dated 03/16/2013  FINDINGS: Gallbladder:  Surgical absence of the gallbladder.  Common bile duct:  Diameter: 16 mm, dilated. No intraluminal filling defects demonstrated.  Liver:  No focal lesion identified. Within normal limits in parenchymal echogenicity.  IVC:  No abnormality visualized.  Pancreas:  Not visualized due to overlying bowel gas.  Spleen:  Size and  appearance within normal limits.  Right Kidney:  Length: 9.7 cm. Cyst in the lower pole measuring 2.3 cm maximal diameter. This was also demonstrated on the previous CT scan.  Left Kidney:  Length: 10.2 cm. Mild prominence of collecting system. No parenchymal mass or atrophy.  Abdominal aorta:  No aneurysm visualized.  Other findings:  None.  IMPRESSION: Previous cholecystitis. Bile duct dilatation as demonstrated on previous CT.  Electronically Signed   By: Lucienne Capers M.D.   On: 07/30/2013 00:44   Ct Abdomen Pelvis W Contrast  07/29/2013   CLINICAL DATA:  Low back pain and abdominal pain with nausea for the last 4 days. Headache, dizziness, numbness in the hands starting this morning.  EXAM: CT ABDOMEN AND PELVIS WITH CONTRAST  TECHNIQUE: Multidetector CT imaging of the abdomen and pelvis was performed using the standard protocol following bolus administration of intravenous contrast.  CONTRAST:  12mL OMNIPAQUE IOHEXOL 300 MG/ML  SOLN  COMPARISON:  DG ABD 1 VIEW dated 07/29/2013; DG LUMBAR SPINE COMPLETE dated 07/29/2013  FINDINGS: Small subpleural nodular opacities are demonstrated in the lung bases. This could represent inflammatory process although three-month follow-up is recommended to exclude persistent lesion. Surgical absence of the gallbladder. Moderate intra and extrahepatic bile duct dilatation down to the level of the ampulla. 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 Endoscopy recommended for further evaluation. No radiopaque common duct stones are visualized. No focal liver lesions are demonstrated. The pancreas, spleen, adrenal glands, abdominal aorta, inferior vena cava, and retroperitoneal lymph nodes are unremarkable. Parenchymal cyst in the right kidney and parapelvic cysts in both kidneys. No solid mass or hydronephrosis in the kidneys. The stomach, small bowel, and colon are not abnormally distended. No  free air or free fluid in the abdomen. Abdominal wall musculature appears intact.  Pelvis: Appendix is normal. Uterus and ovaries are not enlarged. No free or loculated pelvic fluid collections. No inflammatory changes. No significant pelvic lymphadenopathy. Degenerative changes in the lumbar spine. Degenerative changes in the facet joints. Normal alignment. Disc bulge/ protrusions at L2-3, L3-4, and L4-5 appear to cause central canal narrowing.  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 Nonspecific subpleural nodular opacities in the lung bases. Degenerative changes in the lumbar spine with probable multilevel disc disease causing central canal narrowing.   Electronically Signed   By: Lucienne Capers M.D.   On: 07/29/2013 22:18    ROS:  As stated above in the HPI otherwise negative.  Blood pressure 118/53, pulse 86, temperature 98.2 F (36.8 C), temperature source Oral, resp. rate 20, SpO2 97.00%.    PE: Gen: NAD, Alert and Oriented, weak appearing HEENT:  [place] I/AT, EOMI Neck: Supple, no LAD Lungs: CTA Bilaterally CV: RRR without M/G/R ABM: Soft, NTND, +BS Ext: No C/C/E  Assessment/Plan: 1) Ampullary mass. 2) Abnormal liver enzymes.   In retrospect her ampulla appears to be abnormally enlarged when I reviewed the images from Dr. Amedeo Plenty' ERCP.  I will perform an EGD to obtain samples of the ampulla.  Plan: 1) EGD today.  ? ERCP.  Beryle Beams 07/30/2013, 7:12 AM

## 2013-07-30 NOTE — Op Note (Signed)
[facility] [place], [id number]   OPERATIVE PROCEDURE REPORT  PATIENT: Vicki Chavez, Vicki Chavez  MR#: [id number] BIRTHDATE: October 29, 1929  GENDER: Female ENDOSCOPIST: Carol Ada, MD ASSISTANT:   Clemmie Krill, RN, BSN and Corliss Parish, technician PROCEDURE DATE: 07/30/2013 PROCEDURE:   EGD w/ biopsy ASA CLASS:   Class III INDICATIONS: Ampullary mass MEDICATIONS: Versed 4 mg IV and Fentanyl 37.5 mcg IV TOPICAL ANESTHETIC:   none  DESCRIPTION OF PROCEDURE:   After the risks benefits and alternatives of the procedure were thoroughly explained, informed consent was obtained.  The     endoscope was introduced through the mouth  and advanced to the second portion of the duodenum Without limitations.      The instrument was slowly withdrawn as the mucosa was fully examined.      FINDINGS: The endoscope was advanced to the second portion of the duodenum.  There was a large friable spontaneously bleeding ampullary mass.  Attempts to biopsy were not successful with the forward viewing scope and then the duodenoscope subsequently inserted.  Better views of the mass were identified and it measured approximately 2 cm.  Spontaneous drainage of bile was noted.  Cold biopsies of the mass were performed and good samples were obtained. Retroflexed views revealed no abnormalities.     The scope was then withdrawn from the patient and the procedure terminated.  COMPLICATIONS: There were no complications. IMPRESSION: 1) Ampullary mass.  RECOMMENDATIONS: 1) Convert EGD to ERCP for stent placemement.  Even though there was spontaneousl drainage of bile, it appeared that her bilirubin was mildly increasing.  _______________________________ eSigned:  Carol Ada, MD 07/30/2013 12:36 PM

## 2013-07-30 NOTE — Progress Notes (Signed)
INITIAL NUTRITION ASSESSMENT  DOCUMENTATION CODES Per approved criteria  -Not Applicable   INTERVENTION: Diet advancement per MD Recommend [facility] [place]ze TID with meals once diet advanced RD to continue to monitor  NUTRITION DIAGNOSIS: Predicted sub optimal energy intake related to medical condition as evidenced by reports of nausea, vomiting and abdominal pain PTA and current NPO status.   Goal: Pt to meet >/= 90% of their estimated nutrition needs   Monitor:  Diet advancement, PO intake, weight trend, labs  Reason for Assessment: Malnutrition Screening Tool, score of 3  80 y.o. female  Admitting Dx: Abdominal pain  ASSESSMENT: 80 year old female with past medical history of hypertension, dyslpidemia, choledocholelithiasis and status post lap chole 03/16/2013 who presented to [facility] ED from [facility] due to complaints of feeling dizzy, weak and with ongoing nausea, vomiting and periumbilical pain for past few days prior to this admission.  s/p EGD w/ biopsy 4/24 Pt still out of room at time of visit. Per MST filed, pt reported recent unintentional weight loss and eating poorly PTA due to decreased appetite. No evidence of recent weight loss per weight history. Current diet order for NPO.   Labs: elevated AST and ALT, glucose ranging 103 to 139 mg/dL  Height: Ht Readings from Last 1 Encounters:  07/30/13 4\' 10"  (1.473 m)    Weight: Wt Readings from Last 1 Encounters:  07/30/13 114 lb 6.7 oz (51.9 kg)    Ideal Body Weight: 97 lbs  % Ideal Body Weight: 117%  Wt Readings from Last 10 Encounters:  07/30/13 114 lb 6.7 oz (51.9 kg)  07/30/13 114 lb 6.7 oz (51.9 kg)  07/30/13 114 lb 6.7 oz (51.9 kg)  04/05/13 109 lb (49.442 kg)  03/12/13 112 lb 6.4 oz (50.984 kg)  03/12/13 112 lb 6.4 oz (50.984 kg)  03/12/13 112 lb 6.4 oz (50.984 kg)    Usual Body Weight: unknown  % Usual Body Weight: NA  BMI:  Body mass index is 23.92 kg/(m^2).  Estimated Nutritional Needs: Kcal:  1250-1450 Protein: 55-65 grams Fluid: 1.3-1.5 L/day  Skin: WDL  Diet Order: NPO  EDUCATION NEEDS: -No education needs identified at this time  No intake or output data in the 24 hours ending 07/30/13 1402  Last BM: PTA  Labs:   Recent Labs Lab 07/29/13 1534 07/30/13 0153  NA 139 139  K 4.4 3.7  CL 103 103  CO2 23 24  BUN 23 14  CREATININE 0.54 0.52  CALCIUM 9.1 8.6  GLUCOSE 139* 103*    CBG (last 3)  No results found for this basename: GLUCAP,  in the last 72 hours  Scheduled Meds: . aspirin  81 mg Oral Daily  . calcium-vitamin D  1 tablet Oral BID WC  . hydrALAZINE  10 mg Intravenous 3 times per day  . trandolapril  1 mg Oral Daily   And  . hydrochlorothiazide  12.5 mg Oral Daily  .  morphine injection  4 mg Intravenous Once    Continuous Infusions: . sodium chloride    . sodium chloride      Past Medical History  Diagnosis Date  . Hypertension   . High cholesterol   . Osteoporosis   . Prediabetes   . Gall stones     Past Surgical History  Procedure Laterality Date  . Ercp N/A 03/14/2013    Procedure: ENDOSCOPIC RETROGRADE CHOLANGIOPANCREATOGRAPHY (ERCP),stone removal, possible sphincterotomy;  Surgeon: Missy Sabins, MD;  Location: [place];  Service: Endoscopy;  Laterality: N/A;  .  Cholecystectomy N/A 03/16/2013    Procedure: LAPAROSCOPIC CHOLECYSTECTOMY WITH INTRAOPERATIVE CHOLANGIOGRAM;  Surgeon: Earnstine Regal, MD;  Location: [place];  Service: General;  Laterality: N/A;    Pryor Ochoa RD, LDN Inpatient Clinical Dietitian Pager: [phone] After Hours Pager: [phone]

## 2013-07-30 NOTE — Progress Notes (Signed)
PROGRESS NOTE  Vicki Chavez QIO:[id number] DOB: [birth year] DOA: 07/29/2013 PCP: Pcp Not In System  Assessment/Plan: Abdominal pain, nausea and vomiting  - concern for possible ampullary tumor as a cause of abdominal pain, N/V;  - no small bowel obstruction seen on abdominal xray  - continue supportive care with IV fluids, antiemetics and analgesia  - GI for EGD  HTN (hypertension)  - cont home med but add hydralazine 10 mg every 8 hours sch for high BP; current SBP in 190's range   Dyslipidemia  - hold statin    Vertigo, dizziness  - unclear if vertigo or due to N/V and/or high BP  - resolved at this time; pt did get a dose of meclizine in ED  - no acute abnormalities on cervical spine x ray   Elevated LFTs  - chronically elevated as indicated above actually better since 03/2013; concern is obviously for possible tumor involvement in ampullary region  - await GI eval - hold statin therapy   Low back pain  - no acute bony abnormalities on lumbar spine x ray  - some disc bulging noticeable on CT scan which causes narrowing which likely contributes to patient's low back pain      Code Status: full Family Communication: patient and son- answered multiple questions Disposition Plan: admit   Consultants:  GI  Procedures:  EGD  Antibiotics:    HPI/Subjective: Still having abd pain- not requiring pain meds  Objective: Filed Vitals:   07/30/13 0531  BP: 118/53  Pulse: 86  Temp: 98.2 F (36.8 C)  Resp: 20   No intake or output data in the 24 hours ending 07/30/13 1100 Filed Weights   07/30/13 0900  Weight: 51.9 kg (114 lb 6.7 oz)    Exam:   General:  Appears uncomfortable  Cardiovascular: rrr  Respiratory: clear anterior  Abdomen: +BS, soft  Musculoskeletal: moves all 4 ext  Data Reviewed: Basic Metabolic Panel:  Recent Labs Lab 07/29/13 1534 07/30/13 0153  NA 139 139  K 4.4 3.7  CL 103 103  CO2 23 24  GLUCOSE 139* 103*  BUN 23 14    CREATININE 0.54 0.52  CALCIUM 9.1 8.6   Liver Function Tests:  Recent Labs Lab 07/29/13 1534 07/30/13 0153  AST 142* 210*  ALT 104* 131*  ALKPHOS 235* 229*  BILITOT 0.8 1.1  PROT 7.5 6.7  ALBUMIN 3.7 3.3*   No results found for this basename: LIPASE, AMYLASE,  in the last 168 hours No results found for this basename: AMMONIA,  in the last 168 hours CBC:  Recent Labs Lab 07/29/13 1534 07/30/13 0153  WBC 7.4 6.2  NEUTROABS 4.9  --   HGB 12.5 12.0  HCT 37.5 36.4  MCV 79.1 79.1  PLT 242 245   Cardiac Enzymes: No results found for this basename: CKTOTAL, CKMB, CKMBINDEX, TROPONINI,  in the last 168 hours BNP (last 3 results) No results found for this basename: PROBNP,  in the last 8760 hours CBG: No results found for this basename: GLUCAP,  in the last 168 hours  No results found for this or any previous visit (from the past 240 hour(s)).   Studies: Dg Cervical Spine Complete  07/29/2013   CLINICAL DATA:  Left neck pain  EXAM: CERVICAL SPINE - COMPLETE 4+ VIEW  COMPARISON:  None.  FINDINGS: Reversed cervical lordosis. Severe narrowing at C5-6 and C6-7 discs with posterior osteophytic ridging. Prevertebral soft tissues are unremarkable. Foramina grossly patent. Odontoid is grossly intact.  IMPRESSION:  No acute bony pathology.  Degenerative change.   Electronically Signed   By: Maryclare Bean M.D.   On: 07/29/2013 18:49   Dg Lumbar Spine Complete  07/29/2013   CLINICAL DATA:  Low back pain  EXAM: LUMBAR SPINE - COMPLETE 4+ VIEW  COMPARISON:  None.  FINDINGS: Osteopenia. No vertebral compression deformity. Moderate disc space narrowing at L1-2, L2-3, and L3-4. Facet arthropathy at L5-S1.  IMPRESSION: No acute bony pathology.  Chronic changes.   Electronically Signed   By: Maryclare Bean M.D.   On: 07/29/2013 18:47   Dg Abd 1 View  07/29/2013   CLINICAL DATA:  Bloating  EXAM: ABDOMEN - 1 VIEW  COMPARISON:  None.  FINDINGS: No disproportionate dilatation of small bowel. No obvious free  intraperitoneal gas. Degenerative changes in the lumbar spine.  IMPRESSION: Nonobstructive bowel gas pattern.   Electronically Signed   By: Maryclare Bean M.D.   On: 07/29/2013 18:46   US Abdomen Complete  07/30/2013   CLINICAL DATA:  Abnormal liver function studies.  EXAM: ULTRASOUND ABDOMEN COMPLETE  COMPARISON:  CT ABD/PELVIS W CM dated 07/29/2013; DG ABD 1 VIEW dated 07/29/2013; DG CHOLANGIOGRAM OPERATIVE dated 03/16/2013  FINDINGS: Gallbladder:  Surgical absence of the gallbladder.  Common bile duct:  Diameter: 16 mm, dilated. No intraluminal filling defects demonstrated.  Liver:  No focal lesion identified. Within normal limits in parenchymal echogenicity.  IVC:  No abnormality visualized.  Pancreas:  Not visualized due to overlying bowel gas.  Spleen:  Size and appearance within normal limits.  Right Kidney:  Length: 9.7 cm. Cyst in the lower pole measuring 2.3 cm maximal diameter. This was also demonstrated on the previous CT scan.  Left Kidney:  Length: 10.2 cm. Mild prominence of collecting system. No parenchymal mass or atrophy.  Abdominal aorta:  No aneurysm visualized.  Other findings:  None.  IMPRESSION: Previous cholecystitis. Bile duct dilatation as demonstrated on previous CT.   Electronically Signed   By: Lucienne Capers M.D.   On: 07/30/2013 00:44   Ct Abdomen Pelvis W Contrast  07/29/2013   CLINICAL DATA:  Low back pain and abdominal pain with nausea for the last 4 days. Headache, dizziness, numbness in the hands starting this morning.  EXAM: CT ABDOMEN AND PELVIS WITH CONTRAST  TECHNIQUE: Multidetector CT imaging of the abdomen and pelvis was performed using the standard protocol following bolus administration of intravenous contrast.  CONTRAST:  76mL OMNIPAQUE IOHEXOL 300 MG/ML  SOLN  COMPARISON:  DG ABD 1 VIEW dated 07/29/2013; DG LUMBAR SPINE COMPLETE dated 07/29/2013  FINDINGS: Small subpleural nodular opacities are demonstrated in the lung bases. This could represent inflammatory process  although three-month follow-up is recommended to exclude persistent lesion. Surgical absence of the gallbladder. Moderate intra and extrahepatic bile duct dilatation down to the level of the ampulla. 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 Endoscopy recommended for further evaluation. No radiopaque common duct stones are visualized. No focal liver lesions are demonstrated. The pancreas, spleen, adrenal glands, abdominal aorta, inferior vena cava, and retroperitoneal lymph nodes are unremarkable. Parenchymal cyst in the right kidney and parapelvic cysts in both kidneys. No solid mass or hydronephrosis in the kidneys. The stomach, small bowel, and colon are not abnormally distended. No free air or free fluid in the abdomen. Abdominal wall musculature appears intact.  Pelvis: Appendix is normal. Uterus and ovaries are not enlarged. No free or loculated pelvic fluid collections.  No inflammatory changes. No significant pelvic lymphadenopathy. Degenerative changes in the lumbar spine. Degenerative changes in the facet joints. Normal alignment. Disc bulge/ protrusions at L2-3, L3-4, and L4-5 appear to cause central canal narrowing.  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 Nonspecific subpleural nodular opacities in the lung bases. Degenerative changes in the lumbar spine with probable multilevel disc disease causing central canal narrowing.   Electronically Signed   By: Lucienne Capers M.D.   On: 07/29/2013 22:18    Scheduled Meds: . aspirin  81 mg Oral Daily  . calcium-vitamin D  1 tablet Oral BID WC  . hydrALAZINE  10 mg Intravenous 3 times per day  . trandolapril  1 mg Oral Daily   And  . hydrochlorothiazide  12.5 mg Oral Daily  .  morphine injection  4 mg Intravenous Once   Continuous Infusions: . sodium chloride 75 mL/hr at 07/30/13  0112   Antibiotics Given (last 72 hours)   None      Principal Problem:   Abdominal pain Active Problems:   Elevated LFTs   Nausea and vomiting   HTN (hypertension)   Dyslipidemia   Vertigo    Time spent: 35 min    [facility] Pager [phone] If 7PM-7AM, please contact night-coverage at [email or web address], password [facility] 07/30/2013, 11:00 AM  LOS: 1 day

## 2013-07-30 NOTE — Op Note (Signed)
[facility] [place], [id number]   ERCP PROCEDURE REPORT  PATIENT: Vicki Chavez, Vicki Chavez  MR# :[id number] BIRTHDATE: 12/02/1929  GENDER: Female ENDOSCOPIST: Carol Ada, MD REFERRED BY: [facility] PROCEDURE DATE:  07/30/2013 PROCEDURE:   ERCP with biliary stent placement ASA CLASS:   Class II INDICATIONS: Ampullary mass with CBD obstruction MEDICATIONS: Versed 5 mg IV and Fentanyl 50 mcg IV TOPICAL ANESTHETIC: none  DESCRIPTION OF PROCEDURE:   After the risks benefits and alternatives of the procedure were thoroughly explained, informed consent was obtained.  The     endoscope was introduced through the mouth  and advanced to the second portion of the duodenum.  A large 2 cm friable ampullary mass as identified.  Spontaneous drainage of bile was identified.  The CBD was easily cannulated and the guidewire was secured in the right intrahepatic ducts.  Contrast injection revealed a markedly dilated CBD at 2 cm.  No evidence of any retained stones.  An 8.5 Fr x 5 cm stent was easily placed. Excellent drainage was achieved.   The scope was then completely withdrawn from the patient and the procedure terminated.     COMPLICATIONS:  ENDOSCOPIC IMPRESSION: 1) Obstructive ampullary mass s/p stent placement.  RECOMMENDATIONS: 1) Surgical consultation. 2) Await biopsy results from the EGD. 3) Follow liver panel.    _______________________________ eSignedCarol Ada, MD 07/30/2013 2:05 PM   CC:

## 2013-07-31 DIAGNOSIS — M542 Cervicalgia: Secondary | ICD-10-CM

## 2013-07-31 DIAGNOSIS — K838 Other specified diseases of biliary tract: Secondary | ICD-10-CM

## 2013-07-31 DIAGNOSIS — K839 Disease of biliary tract, unspecified: Secondary | ICD-10-CM

## 2013-07-31 LAB — CBC
HCT: 36.2 % (ref 36.0–46.0)
HEMOGLOBIN: 11.4 g/dL — AB (ref 12.0–15.0)
MCH: 25.7 pg — AB (ref 26.0–34.0)
MCHC: 31.5 g/dL (ref 30.0–36.0)
MCV: 81.5 fL (ref 78.0–100.0)
PLATELETS: 240 10*3/uL (ref 150–400)
RBC: 4.44 MIL/uL (ref 3.87–5.11)
RDW: 15.7 % — ABNORMAL HIGH (ref 11.5–15.5)
WBC: 6.8 10*3/uL (ref 4.0–10.5)

## 2013-07-31 LAB — COMPREHENSIVE METABOLIC PANEL
ALBUMIN: 3.1 g/dL — AB (ref 3.5–5.2)
ALT: 95 U/L — ABNORMAL HIGH (ref 0–35)
AST: 83 U/L — AB (ref 0–37)
Alkaline Phosphatase: 204 U/L — ABNORMAL HIGH (ref 39–117)
BUN: 17 mg/dL (ref 6–23)
CALCIUM: 8.7 mg/dL (ref 8.4–10.5)
CO2: 22 mEq/L (ref 19–32)
CREATININE: 0.6 mg/dL (ref 0.50–1.10)
Chloride: 106 mEq/L (ref 96–112)
GFR calc Af Amer: >90 mL/min (ref >90)
GFR calc non Af Amer: 83 mL/min — ABNORMAL LOW (ref >90)
Glucose, Bld: 89 mg/dL (ref 70–99)
Potassium: 4.1 mEq/L (ref 3.7–5.3)
Sodium: 141 mEq/L (ref 137–147)
TOTAL PROTEIN: 6.4 g/dL (ref 6.0–8.3)
Total Bilirubin: 0.5 mg/dL (ref 0.3–1.2)

## 2013-07-31 LAB — GLUCOSE, CAPILLARY: GLUCOSE-CAPILLARY: 96 mg/dL (ref 70–99)

## 2013-07-31 MED ORDER — SODIUM CHLORIDE 0.9 % IJ SOLN
3.0000 mL | Freq: Two times a day (BID) | INTRAMUSCULAR | Status: DC
  Administered 2013-07-31 – 2013-08-01 (×4): 3 mL via INTRAVENOUS

## 2013-07-31 MED ORDER — BLISTEX EX OINT
1.0000 "application " | TOPICAL_OINTMENT | Freq: Two times a day (BID) | CUTANEOUS | Status: DC
  Administered 2013-07-31 – 2013-08-01 (×3): 1 via TOPICAL
  Filled 2013-07-31: qty 10

## 2013-07-31 MED ORDER — SACCHAROMYCES BOULARDII 250 MG PO CAPS
250.0000 mg | ORAL_CAPSULE | Freq: Two times a day (BID) | ORAL | Status: DC
  Administered 2013-07-31 – 2013-08-02 (×5): 250 mg via ORAL
  Filled 2013-07-31 (×8): qty 1

## 2013-07-31 MED ORDER — MENTHOL 3 MG MT LOZG: 1.0000 | LOZENGE | OROMUCOSAL | Status: DC | PRN

## 2013-07-31 MED ORDER — ALUM & MAG HYDROXIDE-SIMETH 200-200-20 MG/5ML PO SUSP
30.0000 mL | Freq: Four times a day (QID) | ORAL | Status: DC | PRN
  Administered 2013-07-31 – 2013-08-01 (×2): 30 mL via ORAL
  Filled 2013-07-31 (×2): qty 30

## 2013-07-31 MED ORDER — BISMUTH SUBSALICYLATE 262 MG/15ML PO SUSP
30.0000 mL | Freq: Three times a day (TID) | ORAL | Status: DC | PRN
  Administered 2013-07-31: 30 mL via ORAL
  Filled 2013-07-31: qty 236

## 2013-07-31 MED ORDER — MAGIC MOUTHWASH: 15.0000 mL | Freq: Four times a day (QID) | ORAL | Status: DC | PRN

## 2013-07-31 MED ORDER — SIMETHICONE 80 MG PO CHEW
80.0000 mg | CHEWABLE_TABLET | Freq: Four times a day (QID) | ORAL | Status: DC | PRN
  Filled 2013-07-31: qty 1

## 2013-07-31 MED ORDER — SODIUM CHLORIDE 0.9 % IJ SOLN: 3.0000 mL | INTRAMUSCULAR | Status: DC | PRN

## 2013-07-31 MED ORDER — DIPHENHYDRAMINE HCL 50 MG/ML IJ SOLN: 12.5000 mg | Freq: Four times a day (QID) | INTRAMUSCULAR | Status: DC | PRN

## 2013-07-31 MED ORDER — POLYETHYLENE GLYCOL 3350 17 G PO PACK: 17.0000 g | PACK | Freq: Two times a day (BID) | ORAL | Status: DC | PRN

## 2013-07-31 MED ORDER — PHENOL 1.4 % MT LIQD: 2.0000 | OROMUCOSAL | Status: DC | PRN

## 2013-07-31 MED ORDER — BISACODYL 10 MG RE SUPP: 10.0000 mg | Freq: Two times a day (BID) | RECTAL | Status: DC | PRN

## 2013-07-31 MED ORDER — LACTATED RINGERS IV BOLUS (SEPSIS): 1000.0000 mL | Freq: Three times a day (TID) | INTRAVENOUS | Status: AC | PRN

## 2013-07-31 NOTE — Progress Notes (Addendum)
[employer], MD, [address] [place], [place] [address] Phone: [phone] FAX: [phone]    Vicki Chavez [id number] [birth year]  CARE TEAM:  PCP: Pcp Not In System  Outpatient Care Team: Patient Care Team: Pcp Not In System as PCP - General  Inpatient Treatment Team: Treatment Team: Attending Provider: [name], DO; Attending Physician: Robbie Lis, MD; Rounding Team: Minerva Ends, MD; Respiratory Therapist: Isaac Bliss, RRT; Consulting Physician: Beryle Beams, MD; Technician: Francene Finders, NT; Consulting Physician: Nolon Nations, MD; Technician: Dorris Singh, [place]; Technician: Army Chaco, NT   Subjective:  Pt crying "I don't want to die" Son in room Pt c/o sore neck/back, belching, nausea, anxiety. MANY questions  Objective:  Vital signs:  Filed Vitals:   07/30/13 1440 07/30/13 1520 07/30/13 2111 07/31/13 0525  BP:  105/59 132/55 119/63  Pulse: 71 73 84 87  Temp:  97.6 F (36.4 C) 98.2 F (36.8 C) 97.9 F (36.6 C)  TempSrc:   Oral Oral  Resp: 19 16 20 20   Height:      Weight:    118 lb 6.2 oz (53.7 kg)  SpO2: 100% 100% 94% 99%       Intake/Output   Yesterday:  04/24 0701 - 04/25 0700 In: 1560 [P.O.:960; I.V.:600] Out: -  This shift:  Total I/O In: 480 [P.O.:480] Out: -   Bowel function:  Flatus: y  BM: loose  Drain: n/a  Physical Exam:  General: Pt awake/alert/oriented x4 in no physical acute distress Eyes: PERRL, normal EOM.  Sclera clear.  No icterus Neuro: CN II-XII intact w/o focal sensory/motor deficits. Lymph: No head/neck/groin lymphadenopathy Psych:  No delerium/psychosis/paranoia.  Very anxious/tearful but consolable HENT: Normocephalic, Mucus membranes moist.  No thrush Neck: Supple, No tracheal deviation Chest:  No chest wall pain w good excursion CV:  Pulses intact.  Regular rhythm MS: Normal AROM mjr joints.  No obvious deformity.   Mild r cervical neck soreness Abdomen: Soft.  Nondistended.  Nontender.  No evidence of peritonitis.  No incarcerated hernias. Ext:  SCDs BLE.  No mjr edema.  No cyanosis Skin: No petechiae / purpura   Problem List:   Principal Problem:   Abdominal pain Active Problems:   Elevated LFTs   Nausea and vomiting   HTN (hypertension)   Dyslipidemia   Vertigo   Mass of ampulla of Vater   Neck pain   Assessment  Vicki Chavez  80 y.o. female  1 Day Post-Op  Procedure(s):  PROCEDURE DATE: 07/30/2013  PROCEDURE: ERCP with biliary stent placement  ASA CLASS: Class II  INDICATIONS: Ampullary mass with CBD obstruction   Ampullary mass c partial obstruction s/p stent but underwhelming ERCP/lap chole 4 months ago  Plan:  -adv to solids -bowel regimen -stent per GI -f/u path -VTE prophylaxis- SCDs, etc -mobilize as tolerated to help recovery  D/C patient from hospital with close outpt surgical followup when patient meets criteria (anticipate in 1-2 day(s)):  Tolerating oral intake well Ambulating in walkways Adequate pain control without IV medications Urinating  Having flatus   I updated the patient's status to the patient & son x 30 minutes.  D/w IM primary service (Dr Eliseo Squires) as well.  No evidence of peritonits or diffuse metastatic disease.  No evidence of need for emergency surgery.  Recommendations were made.  Questions were answered.  Many ?s cannot be answered now until pathology back &  pt's course is followed.  The family expressed understanding & appreciation.  Hopefully just a scarring/inflammatory polyp/stricture & not malignant but needs to be followed.   Adin Hector, M.D., F.A.C.S. Gastrointestinal and Minimally Invasive Surgery [facility] [address], [place], [place] [address] [phone] Main / Paging   07/31/2013   Results:   Labs: Results for orders placed during the hospital encounter of 07/29/13 (from the  past 48 hour(s))  CBC WITH DIFFERENTIAL     Status: None   Collection Time    07/29/13  3:34 PM      Result Value Ref Range   WBC 7.4  4.0 - 10.5 K/uL   RBC 4.74  3.87 - 5.11 MIL/uL   Hemoglobin 12.5  12.0 - 15.0 g/dL   HCT 37.5  36.0 - 46.0 %   MCV 79.1  78.0 - 100.0 fL   MCH 26.4  26.0 - 34.0 pg   MCHC 33.3  30.0 - 36.0 g/dL   RDW 15.1  11.5 - 15.5 %   Platelets 242  150 - 400 K/uL   Neutrophils Relative % 65  43 - 77 %   Neutro Abs 4.9  1.7 - 7.7 K/uL   Lymphocytes Relative 26  12 - 46 %   Lymphs Abs 1.9  0.7 - 4.0 K/uL   Monocytes Relative 7  3 - 12 %   Monocytes Absolute 0.5  0.1 - 1.0 K/uL   Eosinophils Relative 2  0 - 5 %   Eosinophils Absolute 0.1  0.0 - 0.7 K/uL   Basophils Relative 0  0 - 1 %   Basophils Absolute 0.0  0.0 - 0.1 K/uL  COMPREHENSIVE METABOLIC PANEL     Status: Abnormal   Collection Time    07/29/13  3:34 PM      Result Value Ref Range   Sodium 139  137 - 147 mEq/L   Potassium 4.4  3.7 - 5.3 mEq/L   Chloride 103  96 - 112 mEq/L   CO2 23  19 - 32 mEq/L   Glucose, Bld 139 (*) 70 - 99 mg/dL   BUN 23  6 - 23 mg/dL   Creatinine, Ser 0.54  0.50 - 1.10 mg/dL   Calcium 9.1  8.4 - 10.5 mg/dL   Total Protein 7.5  6.0 - 8.3 g/dL   Albumin 3.7  3.5 - 5.2 g/dL   AST 142 (*) 0 - 37 U/L   ALT 104 (*) 0 - 35 U/L   Alkaline Phosphatase 235 (*) 39 - 117 U/L   Total Bilirubin 0.8  0.3 - 1.2 mg/dL   GFR calc non Af Amer 86 (*) >90 mL/min   GFR calc Af Amer >90  >90 mL/min   Comment: (NOTE)     The eGFR has been calculated using the CKD EPI equation.     This calculation has not been validated in all clinical situations.     eGFR's persistently <90 mL/min signify possible Chronic Kidney     Disease.  Randolm Idol, ED     Status: None   Collection Time    07/29/13  3:49 PM      Result Value Ref Range   Troponin i, poc 0.01  0.00 - 0.08 ng/mL   Comment 3            Comment: Due to the release kinetics of cTnI,     a negative result within the first hours  of the onset of symptoms does not rule out     myocardial infarction with certainty.     If myocardial infarction is still suspected,     repeat the test at appropriate intervals.  I-STAT CG4 LACTIC ACID, ED     Status: None   Collection Time    07/29/13  5:45 PM      Result Value Ref Range   Lactic Acid, Venous 0.68  0.5 - 2.2 mmol/L  URINALYSIS, ROUTINE W REFLEX MICROSCOPIC     Status: Abnormal   Collection Time    07/29/13  8:23 PM      Result Value Ref Range   Color, Urine YELLOW  YELLOW   APPearance CLEAR  CLEAR   Specific Gravity, Urine 1.008  1.005 - 1.030   pH 7.5  5.0 - 8.0   Glucose, UA NEGATIVE  NEGATIVE mg/dL   Hgb urine dipstick NEGATIVE  NEGATIVE   Bilirubin Urine NEGATIVE  NEGATIVE   Ketones, ur NEGATIVE  NEGATIVE mg/dL   Protein, ur NEGATIVE  NEGATIVE mg/dL   Urobilinogen, UA 0.2  0.0 - 1.0 mg/dL   Nitrite NEGATIVE  NEGATIVE   Leukocytes, UA TRACE (*) NEGATIVE  URINE MICROSCOPIC-ADD ON     Status: None   Collection Time    07/29/13  8:23 PM      Result Value Ref Range   Squamous Epithelial / LPF RARE  RARE   WBC, UA 0-2  <3 WBC/hpf   RBC / HPF 0-2  <3 RBC/hpf  COMPREHENSIVE METABOLIC PANEL     Status: Abnormal   Collection Time    07/30/13  1:53 AM      Result Value Ref Range   Sodium 139  137 - 147 mEq/L   Potassium 3.7  3.7 - 5.3 mEq/L   Chloride 103  96 - 112 mEq/L   CO2 24  19 - 32 mEq/L   Glucose, Bld 103 (*) 70 - 99 mg/dL   BUN 14  6 - 23 mg/dL   Creatinine, Ser 0.52  0.50 - 1.10 mg/dL   Calcium 8.6  8.4 - 10.5 mg/dL   Total Protein 6.7  6.0 - 8.3 g/dL   Albumin 3.3 (*) 3.5 - 5.2 g/dL   AST 210 (*) 0 - 37 U/L   ALT 131 (*) 0 - 35 U/L   Alkaline Phosphatase 229 (*) 39 - 117 U/L   Total Bilirubin 1.1  0.3 - 1.2 mg/dL   GFR calc non Af Amer 87 (*) >90 mL/min   GFR calc Af Amer >90  >90 mL/min   Comment: (NOTE)     The eGFR has been calculated using the CKD EPI equation.     This calculation has not been validated in all clinical  situations.     eGFR's persistently <90 mL/min signify possible Chronic Kidney     Disease.  CBC     Status: None   Collection Time    07/30/13  1:53 AM      Result Value Ref Range   WBC 6.2  4.0 - 10.5 K/uL   RBC 4.60  3.87 - 5.11 MIL/uL   Hemoglobin 12.0  12.0 - 15.0 g/dL   HCT 36.4  36.0 - 46.0 %   MCV 79.1  78.0 - 100.0 fL   MCH 26.1  26.0 - 34.0 pg   MCHC 33.0  30.0 - 36.0 g/dL   RDW 15.2  11.5 - 15.5 %   Platelets 245  150 -  400 K/uL  TSH     Status: None   Collection Time    07/30/13  1:53 AM      Result Value Ref Range   TSH 0.719  0.350 - 4.500 uIU/mL   Comment: Please note change in reference range.  GLUCOSE, CAPILLARY     Status: None   Collection Time    07/30/13  8:00 AM      Result Value Ref Range   Glucose-Capillary 96  70 - 99 mg/dL  COMPREHENSIVE METABOLIC PANEL     Status: Abnormal   Collection Time    07/31/13  6:13 AM      Result Value Ref Range   Sodium 141  137 - 147 mEq/L   Potassium 4.1  3.7 - 5.3 mEq/L   Chloride 106  96 - 112 mEq/L   CO2 22  19 - 32 mEq/L   Glucose, Bld 89  70 - 99 mg/dL   BUN 17  6 - 23 mg/dL   Creatinine, Ser 0.60  0.50 - 1.10 mg/dL   Calcium 8.7  8.4 - 10.5 mg/dL   Total Protein 6.4  6.0 - 8.3 g/dL   Albumin 3.1 (*) 3.5 - 5.2 g/dL   AST 83 (*) 0 - 37 U/L   ALT 95 (*) 0 - 35 U/L   Alkaline Phosphatase 204 (*) 39 - 117 U/L   Total Bilirubin 0.5  0.3 - 1.2 mg/dL   GFR calc non Af Amer 83 (*) >90 mL/min   GFR calc Af Amer >90  >90 mL/min   Comment: (NOTE)     The eGFR has been calculated using the CKD EPI equation.     This calculation has not been validated in all clinical situations.     eGFR's persistently <90 mL/min signify possible Chronic Kidney     Disease.  CBC     Status: Abnormal   Collection Time    07/31/13  6:13 AM      Result Value Ref Range   WBC 6.8  4.0 - 10.5 K/uL   RBC 4.44  3.87 - 5.11 MIL/uL   Hemoglobin 11.4 (*) 12.0 - 15.0 g/dL   HCT 36.2  36.0 - 46.0 %   MCV 81.5  78.0 - 100.0 fL   MCH 25.7  (*) 26.0 - 34.0 pg   MCHC 31.5  30.0 - 36.0 g/dL   RDW 15.7 (*) 11.5 - 15.5 %   Platelets 240  150 - 400 K/uL  GLUCOSE, CAPILLARY     Status: None   Collection Time    07/31/13  7:29 AM      Result Value Ref Range   Glucose-Capillary 96  70 - 99 mg/dL    Imaging / Studies: Dg Cervical Spine Complete  07/29/2013   CLINICAL DATA:  Left neck pain  EXAM: CERVICAL SPINE - COMPLETE 4+ VIEW  COMPARISON:  None.  FINDINGS: Reversed cervical lordosis. Severe narrowing at C5-6 and C6-7 discs with posterior osteophytic ridging. Prevertebral soft tissues are unremarkable. Foramina grossly patent. Odontoid is grossly intact.  IMPRESSION: No acute bony pathology.  Degenerative change.   Electronically Signed   By: Maryclare Bean M.D.   On: 07/29/2013 18:49   Dg Lumbar Spine Complete  07/29/2013   CLINICAL DATA:  Low back pain  EXAM: LUMBAR SPINE - COMPLETE 4+ VIEW  COMPARISON:  None.  FINDINGS: Osteopenia. No vertebral compression deformity. Moderate disc space narrowing at L1-2, L2-3, and L3-4. Facet arthropathy at L5-S1.  IMPRESSION:  No acute bony pathology.  Chronic changes.   Electronically Signed   By: Maryclare Bean M.D.   On: 07/29/2013 18:47   Dg Abd 1 View  07/29/2013   CLINICAL DATA:  Bloating  EXAM: ABDOMEN - 1 VIEW  COMPARISON:  None.  FINDINGS: No disproportionate dilatation of small bowel. No obvious free intraperitoneal gas. Degenerative changes in the lumbar spine.  IMPRESSION: Nonobstructive bowel gas pattern.   Electronically Signed   By: Maryclare Bean M.D.   On: 07/29/2013 18:46   US Abdomen Complete  07/30/2013   CLINICAL DATA:  Abnormal liver function studies.  EXAM: ULTRASOUND ABDOMEN COMPLETE  COMPARISON:  CT ABD/PELVIS W CM dated 07/29/2013; DG ABD 1 VIEW dated 07/29/2013; DG CHOLANGIOGRAM OPERATIVE dated 03/16/2013  FINDINGS: Gallbladder:  Surgical absence of the gallbladder.  Common bile duct:  Diameter: 16 mm, dilated. No intraluminal filling defects demonstrated.  Liver:  No focal lesion  identified. Within normal limits in parenchymal echogenicity.  IVC:  No abnormality visualized.  Pancreas:  Not visualized due to overlying bowel gas.  Spleen:  Size and appearance within normal limits.  Right Kidney:  Length: 9.7 cm. Cyst in the lower pole measuring 2.3 cm maximal diameter. This was also demonstrated on the previous CT scan.  Left Kidney:  Length: 10.2 cm. Mild prominence of collecting system. No parenchymal mass or atrophy.  Abdominal aorta:  No aneurysm visualized.  Other findings:  None.  IMPRESSION: Previous cholecystitis. Bile duct dilatation as demonstrated on previous CT.   Electronically Signed   By: Lucienne Capers M.D.   On: 07/30/2013 00:44   Ct Abdomen Pelvis W Contrast  07/29/2013   CLINICAL DATA:  Low back pain and abdominal pain with nausea for the last 4 days. Headache, dizziness, numbness in the hands starting this morning.  EXAM: CT ABDOMEN AND PELVIS WITH CONTRAST  TECHNIQUE: Multidetector CT imaging of the abdomen and pelvis was performed using the standard protocol following bolus administration of intravenous contrast.  CONTRAST:  83m OMNIPAQUE IOHEXOL 300 MG/ML  SOLN  COMPARISON:  DG ABD 1 VIEW dated 07/29/2013; DG LUMBAR SPINE COMPLETE dated 07/29/2013  FINDINGS: Small subpleural nodular opacities are demonstrated in the lung bases. This could represent inflammatory process although three-month follow-up is recommended to exclude persistent lesion. Surgical absence of the gallbladder. Moderate intra and extrahepatic bile duct dilatation down to the level of the ampulla. 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 Endoscopy recommended for further evaluation. No radiopaque common duct stones are visualized. No focal liver lesions are demonstrated. The pancreas, spleen, adrenal glands, abdominal aorta, inferior vena cava, and retroperitoneal lymph nodes are unremarkable. Parenchymal cyst in the  right kidney and parapelvic cysts in both kidneys. No solid mass or hydronephrosis in the kidneys. The stomach, small bowel, and colon are not abnormally distended. No free air or free fluid in the abdomen. Abdominal wall musculature appears intact.  Pelvis: Appendix is normal. Uterus and ovaries are not enlarged. No free or loculated pelvic fluid collections. No inflammatory changes. No significant pelvic lymphadenopathy. Degenerative changes in the lumbar spine. Degenerative changes in the facet joints. Normal alignment. Disc bulge/ protrusions at L2-3, L3-4, and L4-5 appear to cause central canal narrowing.  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 Nonspecific subpleural nodular opacities in the lung bases. Degenerative changes in the lumbar spine with probable multilevel disc disease  causing central canal narrowing.   Electronically Signed   By: Lucienne Capers M.D.   On: 07/29/2013 22:18   Dg Ercp  07/30/2013   CLINICAL DATA:  Ampullary mass  EXAM: ERCP  TECHNIQUE: Multiple spot images obtained with the fluoroscopic device and submitted for interpretation post-procedure.  COMPARISON:  None.  FINDINGS: Three spot films were obtained during an ERCP. The initial image show some contrast within the common bile duct which is dilated. A plastic stent was then placed across the obstructive change. 50 seconds of fluoroscopy was utilized.   Electronically Signed   By: Inez Catalina M.D.   On: 07/30/2013 14:08    Medications / Allergies: per chart  Antibiotics: Anti-infectives   None       Note: This dictation was prepared with Dragon/digital dictation along with [employer]. Any transcriptional errors that result from this process are unintentional.

## 2013-07-31 NOTE — Progress Notes (Signed)
PROGRESS NOTE  Vicki Chavez DDU:[id number] DOB: [birth year] DOA: 07/29/2013 PCP: Pcp Not In System  Assessment/Plan: Abdominal pain, nausea and vomiting  - concern for possible ampullary tumor as a cause of abdominal pain, N/V;  -stent placed by GI after biopsies done - continue supportive care with IV fluids, antiemetics and analgesia  -appreciate surgery and GI -multiple questions answered  HTN (hypertension)  - cont home med but add hydralazine 10 mg every 8 hours sch for high BP; current SBP in 190's range   Dyslipidemia  - hold statin    Vertigo, dizziness  - resolved  Elevated LFTs  - chronically elevated as indicated above actually better since 03/2013; concern is obviously for possible tumor involvement in ampullary region  - decreasing after stent - hold statin therapy   Low back pain  - no acute bony abnormalities on lumbar spine x ray  - some disc bulging noticeable on CT scan which causes narrowing which likely contributes to patient's low back pain  -resolved     Code Status: full Family Communication: patient and son- answered multiple questions Disposition Plan: admit   Consultants:  GI  Procedures:  EGD  Antibiotics:    HPI/Subjective: Multiple questions answered No SOB, no CP +Gas  Objective: Filed Vitals:   07/31/13 0525  BP: 119/63  Pulse: 87  Temp: 97.9 F (36.6 C)  Resp: 20    Intake/Output Summary (Last 24 hours) at 07/31/13 1125 Last data filed at 07/31/13 2376  Gross per 24 hour  Intake   2040 ml  Output      0 ml  Net   2040 ml   Filed Weights   07/30/13 0900 07/31/13 0525  Weight: 51.9 kg (114 lb 6.7 oz) 53.7 kg (118 lb 6.2 oz)    Exam:   General:  tearful  Cardiovascular: rrr  Respiratory: clear anterior  Abdomen: +BS, soft  Musculoskeletal: moves all 4 ext  Data Reviewed: Basic Metabolic Panel:  Recent Labs Lab 07/29/13 1534 07/30/13 0153 07/31/13 0613  NA 139 139 141  K 4.4 3.7 4.1  CL  103 103 106  CO2 23 24 22   GLUCOSE 139* 103* 89  BUN 23 14 17   CREATININE 0.54 0.52 0.60  CALCIUM 9.1 8.6 8.7   Liver Function Tests:  Recent Labs Lab 07/29/13 1534 07/30/13 0153 07/31/13 0613  AST 142* 210* 83*  ALT 104* 131* 95*  ALKPHOS 235* 229* 204*  BILITOT 0.8 1.1 0.5  PROT 7.5 6.7 6.4  ALBUMIN 3.7 3.3* 3.1*   No results found for this basename: LIPASE, AMYLASE,  in the last 168 hours No results found for this basename: AMMONIA,  in the last 168 hours CBC:  Recent Labs Lab 07/29/13 1534 07/30/13 0153 07/31/13 0613  WBC 7.4 6.2 6.8  NEUTROABS 4.9  --   --   HGB 12.5 12.0 11.4*  HCT 37.5 36.4 36.2  MCV 79.1 79.1 81.5  PLT 242 245 240   Cardiac Enzymes: No results found for this basename: CKTOTAL, CKMB, CKMBINDEX, TROPONINI,  in the last 168 hours BNP (last 3 results) No results found for this basename: PROBNP,  in the last 8760 hours CBG:  Recent Labs Lab 07/30/13 0800 07/31/13 0729  GLUCAP 96 96    No results found for this or any previous visit (from the past 240 hour(s)).   Studies: Dg Cervical Spine Complete  07/29/2013   CLINICAL DATA:  Left neck pain  EXAM: CERVICAL SPINE - COMPLETE 4+ VIEW  COMPARISON:  None.  FINDINGS: Reversed cervical lordosis. Severe narrowing at C5-6 and C6-7 discs with posterior osteophytic ridging. Prevertebral soft tissues are unremarkable. Foramina grossly patent. Odontoid is grossly intact.  IMPRESSION: No acute bony pathology.  Degenerative change.   Electronically Signed   By: Maryclare Bean M.D.   On: 07/29/2013 18:49   Dg Lumbar Spine Complete  07/29/2013   CLINICAL DATA:  Low back pain  EXAM: LUMBAR SPINE - COMPLETE 4+ VIEW  COMPARISON:  None.  FINDINGS: Osteopenia. No vertebral compression deformity. Moderate disc space narrowing at L1-2, L2-3, and L3-4. Facet arthropathy at L5-S1.  IMPRESSION: No acute bony pathology.  Chronic changes.   Electronically Signed   By: Maryclare Bean M.D.   On: 07/29/2013 18:47   Dg Abd 1  View  07/29/2013   CLINICAL DATA:  Bloating  EXAM: ABDOMEN - 1 VIEW  COMPARISON:  None.  FINDINGS: No disproportionate dilatation of small bowel. No obvious free intraperitoneal gas. Degenerative changes in the lumbar spine.  IMPRESSION: Nonobstructive bowel gas pattern.   Electronically Signed   By: Maryclare Bean M.D.   On: 07/29/2013 18:46   US Abdomen Complete  07/30/2013   CLINICAL DATA:  Abnormal liver function studies.  EXAM: ULTRASOUND ABDOMEN COMPLETE  COMPARISON:  CT ABD/PELVIS W CM dated 07/29/2013; DG ABD 1 VIEW dated 07/29/2013; DG CHOLANGIOGRAM OPERATIVE dated 03/16/2013  FINDINGS: Gallbladder:  Surgical absence of the gallbladder.  Common bile duct:  Diameter: 16 mm, dilated. No intraluminal filling defects demonstrated.  Liver:  No focal lesion identified. Within normal limits in parenchymal echogenicity.  IVC:  No abnormality visualized.  Pancreas:  Not visualized due to overlying bowel gas.  Spleen:  Size and appearance within normal limits.  Right Kidney:  Length: 9.7 cm. Cyst in the lower pole measuring 2.3 cm maximal diameter. This was also demonstrated on the previous CT scan.  Left Kidney:  Length: 10.2 cm. Mild prominence of collecting system. No parenchymal mass or atrophy.  Abdominal aorta:  No aneurysm visualized.  Other findings:  None.  IMPRESSION: Previous cholecystitis. Bile duct dilatation as demonstrated on previous CT.   Electronically Signed   By: Lucienne Capers M.D.   On: 07/30/2013 00:44   Ct Abdomen Pelvis W Contrast  07/29/2013   CLINICAL DATA:  Low back pain and abdominal pain with nausea for the last 4 days. Headache, dizziness, numbness in the hands starting this morning.  EXAM: CT ABDOMEN AND PELVIS WITH CONTRAST  TECHNIQUE: Multidetector CT imaging of the abdomen and pelvis was performed using the standard protocol following bolus administration of intravenous contrast.  CONTRAST:  74mL OMNIPAQUE IOHEXOL 300 MG/ML  SOLN  COMPARISON:  DG ABD 1 VIEW dated 07/29/2013; DG  LUMBAR SPINE COMPLETE dated 07/29/2013  FINDINGS: Small subpleural nodular opacities are demonstrated in the lung bases. This could represent inflammatory process although three-month follow-up is recommended to exclude persistent lesion. Surgical absence of the gallbladder. Moderate intra and extrahepatic bile duct dilatation down to the level of the ampulla. 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 Endoscopy recommended for further evaluation. No radiopaque common duct stones are visualized. No focal liver lesions are demonstrated. The pancreas, spleen, adrenal glands, abdominal aorta, inferior vena cava, and retroperitoneal lymph nodes are unremarkable. Parenchymal cyst in the right kidney and parapelvic cysts in both kidneys. No solid mass or hydronephrosis in the kidneys. The stomach, small bowel, and colon are not abnormally distended. No  free air or free fluid in the abdomen. Abdominal wall musculature appears intact.  Pelvis: Appendix is normal. Uterus and ovaries are not enlarged. No free or loculated pelvic fluid collections. No inflammatory changes. No significant pelvic lymphadenopathy. Degenerative changes in the lumbar spine. Degenerative changes in the facet joints. Normal alignment. Disc bulge/ protrusions at L2-3, L3-4, and L4-5 appear to cause central canal narrowing.  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 Nonspecific subpleural nodular opacities in the lung bases. Degenerative changes in the lumbar spine with probable multilevel disc disease causing central canal narrowing.   Electronically Signed   By: Lucienne Capers M.D.   On: 07/29/2013 22:18   Dg Ercp  07/30/2013   CLINICAL DATA:  Ampullary mass  EXAM: ERCP  TECHNIQUE: Multiple spot images obtained with the fluoroscopic device and submitted for interpretation  post-procedure.  COMPARISON:  None.  FINDINGS: Three spot films were obtained during an ERCP. The initial image show some contrast within the common bile duct which is dilated. A plastic stent was then placed across the obstructive change. 50 seconds of fluoroscopy was utilized.   Electronically Signed   By: Inez Catalina M.D.   On: 07/30/2013 14:08    Scheduled Meds: . aspirin  81 mg Oral Daily  . calcium-vitamin D  1 tablet Oral BID WC  . hydrALAZINE  10 mg Intravenous 3 times per day  . trandolapril  1 mg Oral Daily   And  . hydrochlorothiazide  12.5 mg Oral Daily  . lip balm  1 application Topical BID  .  morphine injection  4 mg Intravenous Once  . saccharomyces boulardii  250 mg Oral BID  . sodium chloride  3 mL Intravenous Q12H   Continuous Infusions:   Antibiotics Given (last 72 hours)   None      Principal Problem:   Abdominal pain Active Problems:   Elevated LFTs   Nausea and vomiting   HTN (hypertension)   Dyslipidemia   Vertigo   Mass of ampulla of Vater   Neck pain    Time spent: 35 min    [name]  [facility] Pager [phone] If 7PM-7AM, please contact night-coverage at [email or web address], password [facility] 07/31/2013, 11:25 AM  LOS: 2 days

## 2013-08-01 LAB — COMPREHENSIVE METABOLIC PANEL
ALK PHOS: 185 U/L — AB (ref 39–117)
ALT: 74 U/L — ABNORMAL HIGH (ref 0–35)
AST: 47 U/L — ABNORMAL HIGH (ref 0–37)
Albumin: 3.3 g/dL — ABNORMAL LOW (ref 3.5–5.2)
BILIRUBIN TOTAL: 0.4 mg/dL (ref 0.3–1.2)
BUN: 16 mg/dL (ref 6–23)
CHLORIDE: 102 meq/L (ref 96–112)
CO2: 24 mEq/L (ref 19–32)
Calcium: 9.2 mg/dL (ref 8.4–10.5)
Creatinine, Ser: 0.59 mg/dL (ref 0.50–1.10)
GFR, EST NON AFRICAN AMERICAN: 84 mL/min — AB (ref >90)
GLUCOSE: 104 mg/dL — AB (ref 70–99)
Potassium: 4.1 mEq/L (ref 3.7–5.3)
Sodium: 140 mEq/L (ref 137–147)
Total Protein: 6.9 g/dL (ref 6.0–8.3)

## 2013-08-01 LAB — GLUCOSE, CAPILLARY: GLUCOSE-CAPILLARY: 131 mg/dL — AB (ref 70–99)

## 2013-08-01 MED ORDER — DOCUSATE SODIUM 100 MG PO CAPS
100.0000 mg | ORAL_CAPSULE | Freq: Two times a day (BID) | ORAL | Status: DC
  Administered 2013-08-01 – 2013-08-02 (×3): 100 mg via ORAL
  Filled 2013-08-01 (×3): qty 1

## 2013-08-01 NOTE — Progress Notes (Signed)
PROGRESS NOTE  Vicki Chavez KNL:[id number] DOB: [birth year] DOA: 07/29/2013 PCP: Pcp Not In System  Assessment/Plan: Abdominal pain, nausea and vomiting  - concern for possible ampullary tumor as a cause of abdominal pain, N/V;  -stent placed by GI after biopsies done - continue supportive care with IV fluids, antiemetics and analgesia  -appreciate surgery and GI -multiple questions answered  HTN (hypertension)  - cont home med but add hydralazine 10 mg every 8 hours sch for high BP; current SBP in 190's range   Dyslipidemia  - hold statin    Vertigo, dizziness  - resolved  Elevated LFTs  - chronically elevated as indicated above actually better since 03/2013; concern is obviously for possible tumor involvement in ampullary region  - decreasing after stent - hold statin therapy   Low back pain  - no acute bony abnormalities on lumbar spine x ray  - some disc bulging noticeable on CT scan which causes narrowing which likely contributes to patient's low back pain  -resolved     Code Status: full Family Communication: patient and daughter Disposition Plan: d/c in AM   Consultants:  GI  Procedures:  EGD- biopsy  ERCP   HPI/Subjective: +BM this AM +gas and fullness  Objective: Filed Vitals:   08/01/13 0602  BP: 122/56  Pulse: 87  Temp: 97.7 F (36.5 C)  Resp: 19    Intake/Output Summary (Last 24 hours) at 08/01/13 1030 Last data filed at 08/01/13 0906  Gross per 24 hour  Intake   1200 ml  Output      0 ml  Net   1200 ml   Filed Weights   07/30/13 0900 07/31/13 0525 08/01/13 0602  Weight: 51.9 kg (114 lb 6.7 oz) 53.7 kg (118 lb 6.2 oz) 54.1 kg (119 lb 4.3 oz)    Exam:   General:  Up walking hallway  Cardiovascular: rrr  Respiratory: clear anterior  Abdomen: +BS, soft  Musculoskeletal: moves all 4 ext  Data Reviewed: Basic Metabolic Panel:  Recent Labs Lab 07/29/13 1534 07/30/13 0153 07/31/13 0613 08/01/13 0340  NA 139 139 141  140  K 4.4 3.7 4.1 4.1  CL 103 103 106 102  CO2 23 24 22 24   GLUCOSE 139* 103* 89 104*  BUN 23 14 17 16   CREATININE 0.54 0.52 0.60 0.59  CALCIUM 9.1 8.6 8.7 9.2   Liver Function Tests:  Recent Labs Lab 07/29/13 1534 07/30/13 0153 07/31/13 0613 08/01/13 0340  AST 142* 210* 83* 47*  ALT 104* 131* 95* 74*  ALKPHOS 235* 229* 204* 185*  BILITOT 0.8 1.1 0.5 0.4  PROT 7.5 6.7 6.4 6.9  ALBUMIN 3.7 3.3* 3.1* 3.3*   No results found for this basename: LIPASE, AMYLASE,  in the last 168 hours No results found for this basename: AMMONIA,  in the last 168 hours CBC:  Recent Labs Lab 07/29/13 1534 07/30/13 0153 07/31/13 0613  WBC 7.4 6.2 6.8  NEUTROABS 4.9  --   --   HGB 12.5 12.0 11.4*  HCT 37.5 36.4 36.2  MCV 79.1 79.1 81.5  PLT 242 245 240   Cardiac Enzymes: No results found for this basename: CKTOTAL, CKMB, CKMBINDEX, TROPONINI,  in the last 168 hours BNP (last 3 results) No results found for this basename: PROBNP,  in the last 8760 hours CBG:  Recent Labs Lab 07/30/13 0800 07/31/13 0729 08/01/13 0722  GLUCAP 96 96 131*    No results found for this or any previous visit (from the past 240  hour(s)).   Studies: Dg Ercp  07/30/2013   CLINICAL DATA:  Ampullary mass  EXAM: ERCP  TECHNIQUE: Multiple spot images obtained with the fluoroscopic device and submitted for interpretation post-procedure.  COMPARISON:  None.  FINDINGS: Three spot films were obtained during an ERCP. The initial image show some contrast within the common bile duct which is dilated. A plastic stent was then placed across the obstructive change. 50 seconds of fluoroscopy was utilized.   Electronically Signed   By: Inez Catalina M.D.   On: 07/30/2013 14:08    Scheduled Meds: . aspirin  81 mg Oral Daily  . calcium-vitamin D  1 tablet Oral BID WC  . hydrALAZINE  10 mg Intravenous 3 times per day  . trandolapril  1 mg Oral Daily   And  . hydrochlorothiazide  12.5 mg Oral Daily  . lip balm  1  application Topical BID  .  morphine injection  4 mg Intravenous Once  . saccharomyces boulardii  250 mg Oral BID  . sodium chloride  3 mL Intravenous Q12H   Continuous Infusions:   Antibiotics Given (last 72 hours)   None      Principal Problem:   Abdominal pain Active Problems:   Elevated LFTs   Nausea and vomiting   HTN (hypertension)   Dyslipidemia   Vertigo   Mass of ampulla of Vater   Neck pain    Time spent: 25 min    [name]  [facility] Pager [phone] If 7PM-7AM, please contact night-coverage at [email or web address], password [facility] 08/01/2013, 10:30 AM  LOS: 3 days

## 2013-08-01 NOTE — Progress Notes (Addendum)
[place], MD, [address] [place], [place] [address] Phone: [phone] FAX: [phone]    Vicki Chavez [id number] 1930-03-22  CARE TEAM:  PCP: Pcp Not In System  Outpatient Care Team: Patient Care Team: Pcp Not In System as PCP - General  Inpatient Treatment Team: Treatment Team: Attending Provider: [name], DO; Attending Physician: Robbie Lis, MD; Rounding Team: Minerva Ends, MD; Respiratory Therapist: Isaac Bliss, RRT; Consulting Physician: Beryle Beams, MD; Technician: Francene Finders, NT; Consulting Physician: Nolon Nations, MD; Technician: Dorris Singh, [place]; Technician: Army Chaco, NT; Technician: Thomas Hoff, NT; Registered Nurse: Anola Gurney, RN   Subjective:  Pt feeling better Expecting daughter soon Less bloating/gassiness Pt c/o sore neck/back, feet - but better. Many questions  Objective:  Vital signs:  Filed Vitals:   07/31/13 0525 07/31/13 1429 07/31/13 2157 08/01/13 0602  BP: 119/63 120/55 142/72 122/56  Pulse: 87 87 80 87  Temp: 97.9 F (36.6 C) 97.9 F (36.6 C) 97.7 F (36.5 C) 97.7 F (36.5 C)  TempSrc: Oral Oral Oral Oral  Resp: _0 Height:      Weight: 118 lb 6.2 oz (53.7 kg)   119 lb 4.3 oz (54.1 kg)  SpO2: 99% 97% 100% 96%       Intake/Output   Yesterday:  04/25 0701 - 04/26 0700 In: 1320 [P.O.:1320] Out: -  This shift:     Bowel function:  Flatus: y  BM: few small   Drain: n/a  Physical Exam:  General: Pt awake/alert/oriented x4 in no physical acute distress Eyes: PERRL, normal EOM.  Sclera clear.  No icterus Neuro: CN II-XII intact w/o focal sensory/motor deficits. Lymph: No head/neck/groin lymphadenopathy Psych:  No delerium/psychosis/paranoia.  More birght, smiling but a little anxious. Consolable HENT: Normocephalic, Mucus membranes moist.  No thrush Neck: Supple, No tracheal deviation Chest:  No  chest wall pain w good excursion CV:  Pulses intact.  Regular rhythm MS: Normal AROM mjr joints.  No obvious deformity.  Mild r cervical neck soreness Abdomen: Soft.  Nondistended.  Nontender.  No evidence of peritonitis.  No incarcerated hernias. Ext:  SCDs BLE.  No mjr edema.  No cyanosis Skin: No petechiae / purpura   Problem List:   Principal Problem:   Abdominal pain Active Problems:   Elevated LFTs   Nausea and vomiting   HTN (hypertension)   Dyslipidemia   Vertigo   Mass of ampulla of Vater   Neck pain   Assessment  Vicki Chavez  80 y.o. 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  LFTs & Sx better are good signs -f/u path -VTE prophylaxis- SCDs, etc -mobilize as tolerated to help recovery  D/C patient from hospital with close outpt surgical followup when patient meets criteria (anticipate later today):  Tolerating oral intake well Ambulating in walkways Adequate pain control without IV medications Urinating  Having flatus   I updated the patient's status to the patient x 15 minutes.  No evidence of peritonits or diffuse metastatic disease.  No evidence of need for emergency surgery.  Recommendations were made.  Questions  were answered.  Many ?s cannot be answered now until pathology back & pt's course is followed.  The family expressed understanding & appreciation.  Hopefully just a scarring/inflammatory polyp/stricture & not malignant but needs to be followed.  .ADDENDUM:  Per the pr's request, I came back to d/w pt's daughter.  I updated the patient's status to the pt & her daughter.  Recommendations were made.  ## Questions were answered.  The family expressed  understanding & appreciation. Pt originally from  they are considering returning there to visit [place] clinic & get 2nd (or only) opinion there.  Time = 20 more min  Adin Hector, M.D., F.A.C.S. Gastrointestinal and Minimally Invasive Surgery [facility] [address], [place], [place] [address] [phone] Main / Paging   08/01/2013   Results:   Labs: Results for orders placed during the hospital encounter of 07/29/13 (from the past 48 hour(s))  COMPREHENSIVE METABOLIC PANEL     Status: Abnormal   Collection Time    07/31/13  6:13 AM      Result Value Ref Range   Sodium 141  137 - 147 mEq/L   Potassium 4.1  3.7 - 5.3 mEq/L   Chloride 106  96 - 112 mEq/L   CO2 22  19 - 32 mEq/L   Glucose, Bld 89  70 - 99 mg/dL   BUN 17  6 - 23 mg/dL   Creatinine, Ser 0.60  0.50 - 1.10 mg/dL   Calcium 8.7  8.4 - 10.5 mg/dL   Total Protein 6.4  6.0 - 8.3 g/dL   Albumin 3.1 (*) 3.5 - 5.2 g/dL   AST 83 (*) 0 - 37 U/L   ALT 95 (*) 0 - 35 U/L   Alkaline Phosphatase 204 (*) 39 - 117 U/L   Total Bilirubin 0.5  0.3 - 1.2 mg/dL   GFR calc non Af Amer 83 (*) >90 mL/min   GFR calc Af Amer >90  >90 mL/min   Comment: (NOTE)     The eGFR has been calculated using the CKD EPI equation.     This calculation has not been validated in all clinical situations.     eGFR's persistently <90 mL/min signify possible Chronic Kidney     Disease.  CBC     Status: Abnormal   Collection Time    07/31/13  6:13 AM      Result Value Ref Range   WBC 6.8  4.0 - 10.5 K/uL   RBC 4.44  3.87 - 5.11 MIL/uL   Hemoglobin 11.4 (*) 12.0 - 15.0 g/dL   HCT 36.2  36.0 - 46.0 %   MCV 81.5  78.0 - 100.0 fL   MCH 25.7 (*) 26.0 - 34.0 pg   MCHC 31.5  30.0 - 36.0 g/dL   RDW 15.7 (*) 11.5 - 15.5 %   Platelets 240  150 - 400 K/uL  GLUCOSE, CAPILLARY     Status: None   Collection Time    07/31/13  7:29 AM      Result Value Ref Range   Glucose-Capillary 96  70 - 99 mg/dL  COMPREHENSIVE  METABOLIC PANEL     Status: Abnormal   Collection Time    08/01/13  3:40 AM      Result Value Ref Range   Sodium 140  137 - 147 mEq/L   Potassium 4.1  3.7 - 5.3 mEq/L   Chloride 102  96 - 112 mEq/L   CO2  24  19 - 32 mEq/L   Glucose, Bld 104 (*) 70 - 99 mg/dL   BUN 16  6 - 23 mg/dL   Creatinine, Ser 0.59  0.50 - 1.10 mg/dL   Calcium 9.2  8.4 - 10.5 mg/dL   Total Protein 6.9  6.0 - 8.3 g/dL   Albumin 3.3 (*) 3.5 - 5.2 g/dL   AST 47 (*) 0 - 37 U/L   ALT 74 (*) 0 - 35 U/L   Alkaline Phosphatase 185 (*) 39 - 117 U/L   Total Bilirubin 0.4  0.3 - 1.2 mg/dL   GFR calc non Af Amer 84 (*) >90 mL/min   GFR calc Af Amer >90  >90 mL/min   Comment: (NOTE)     The eGFR has been calculated using the CKD EPI equation.     This calculation has not been validated in all clinical situations.     eGFR's persistently <90 mL/min signify possible Chronic Kidney     Disease.  GLUCOSE, CAPILLARY     Status: Abnormal   Collection Time    08/01/13  7:22 AM      Result Value Ref Range   Glucose-Capillary 131 (*) 70 - 99 mg/dL    Imaging / Studies: Dg Ercp  07/30/2013   CLINICAL DATA:  Ampullary mass  EXAM: ERCP  TECHNIQUE: Multiple spot images obtained with the fluoroscopic device and submitted for interpretation post-procedure.  COMPARISON:  None.  FINDINGS: Three spot films were obtained during an ERCP. The initial image show some contrast within the common bile duct which is dilated. A plastic stent was then placed across the obstructive change. 50 seconds of fluoroscopy was utilized.   Electronically Signed   By: Inez Catalina M.D.   On: 07/30/2013 14:08    Medications / Allergies: per chart  Antibiotics: Anti-infectives   None       Note: This dictation was prepared with Dragon/digital dictation along with [employer]. Any transcriptional errors that result from this process are unintentional.

## 2013-08-01 NOTE — Discharge Instructions (Signed)
Endoscopic Retrograde Cholangiopancreatography (ERCP), Care After Refer to this sheet in the next few weeks. These instructions provide you with information on caring for yourself after your procedure. Your health care provider may also give you more specific instructions. Your treatment has been planned according to current medical practices, but problems sometimes occur. Call your health care provider if you have any problems or questions after your procedure.  WHAT TO EXPECT AFTER THE PROCEDURE  After your procedure, it is typical to feel:   Soreness in your throat.   Sick to your stomach (nauseous).   Bloated.  Dizzy.   Fatigued. HOME CARE INSTRUCTIONS  Have a friend or family member stay with you for the first 24 hours after your procedure.  Start taking your usual medicines and eating normally as soon as you feel well enough to do so or as directed by your health care provider. SEEK MEDICAL CARE IF:  You have abdominal pain.   You develop signs of infection, such as:   Chills.   Feeling unwell.  SEEK IMMEDIATE MEDICAL CARE IF:  You have difficulty swallowing.  You have worsening throat, chest, or abdominal pain.  You vomit.  You have bloody or very black stools.  You have a fever. Document Released: 01/13/2013 Document Reviewed: 09/28/2012 [facility] Patient Information 2014 [place].   Biliary Drainage Catheter Home Guide A biliary drainage catheter is a tube that is inserted through your intestine the bile ducts in your liver. The purpose of a biliary drainage catheter is to prevent backup of bile into the liver. Bile is a thick yellow or green fluid that helps digest fat in foods. Backup of bile can occur when there is a blockage preventing bile from moving from the bile ducts into your small intestine, as it normally should. This can occur from a tumor, gallstones, or scar tissue. There are three types of biliary drainage:  External biliary drainage  With this type, bile is only drained into a collection bag outside your body (external collection bag).  Internal-external biliary drainage Bile is drained to an external collection bag as well as into your small intestine.  Internal biliary drainage Bile is only drained into your small intestine (your stent is inside your intestine) 1.  SEEK MEDICAL CARE IF:  Your pain gets worse after an initial improvement.  You have any questions about your tube.  Your redness, soreness, or swelling at the tube insertion site gets worse despite good cleaning.  Your skin breaks down around the tube.  You have leakage of bile around the tube.  Your tube becomes blocked or clogged.  Your catheter is dislodged or comes out. SEEK IMMEDIATE MEDICAL CARE IF:  You have a fever.  You have chills or increased pain. MAKE SURE YOU:  Understand these instructions.  Will watch your condition.  Will get help right away if you are not doing well or get worse. Document Released: 01/13/2013 Document Reviewed: 11/30/2012 [facility] Patient Information 2014 [place], [place].   GETTING TO GOOD BOWEL HEALTH. Irregular bowel habits such as constipation and diarrhea can lead to many problems over time.  Having one soft bowel movement a day is the most important way to prevent further problems.  The anorectal canal is designed to handle stretching and feces to safely manage our ability to get rid of solid waste (feces, poop, stool) out of our body.  BUT, hard constipated stools can act like ripping concrete bricks and diarrhea can be a burning fire to this very  sensitive area of our body, causing inflamed hemorrhoids, anal fissures, increasing risk is perirectal abscesses, abdominal pain/bloating, an making irritable bowel worse.     The goal: ONE SOFT BOWEL MOVEMENT A DAY!  To have soft, regular bowel movements:    Drink at least 8 tall glasses of water a day.     Take plenty of fiber.  Fiber is the undigested part  of plant food that passes into the colon, acting s natures broom to encourage bowel motility and movement.  Fiber can absorb and hold large amounts of water. This results in a larger, bulkier stool, which is soft and easier to pass. Work gradually over several weeks up to 6 servings a day of fiber (25g a day even more if needed) in the form of: o Vegetables -- Root (potatoes, carrots, turnips), leafy green (lettuce, salad greens, celery, spinach), or cooked high residue (cabbage, broccoli, etc) o Fruit -- Fresh (unpeeled skin & pulp), Dried (prunes, apricots, cherries, etc ),  or stewed ( applesauce)  o Whole grain breads, pasta, etc (whole wheat)  o Bran cereals    Bulking Agents -- This type of water-retaining fiber generally is easily obtained each day by one of the following:  o Psyllium bran -- The psyllium plant is remarkable because its ground seeds can retain so much water. This product is available as Metamucil, Konsyl, Effersyllium, Per Diem Fiber, or the less expensive generic preparation in drug and health food stores. Although labeled a laxative, it really is not a laxative.  o Methylcellulose -- This is another fiber derived from wood which also retains water. It is available as Citrucel. o Polyethylene Glycol - and artificial fiber commonly called Miralax or Glycolax.  It is helpful for people with gassy or bloated feelings with regular fiber o Flax Seed - a less gassy fiber than psyllium   No reading or other relaxing activity while on the toilet. If bowel movements take longer than 5 minutes, you are too constipated   AVOID CONSTIPATION.  High fiber and water intake usually takes care of this.  Sometimes a laxative is needed to stimulate more frequent bowel movements, but    Laxatives are not a good long-term solution as it can wear the colon out. o Osmotics (Milk of Magnesia, Fleets phosphosoda, Magnesium citrate, MiraLax, GoLytely) are safer than  o Stimulants (Senokot, Castor  Oil, Dulcolax, Ex Lax)    o Do not take laxatives for more than 7days in a row.    IF SEVERELY CONSTIPATED, try a Bowel Retraining Program: o Do not use laxatives.  o Eat a diet high in roughage, such as bran cereals and leafy vegetables.  o Drink six (6) ounces of prune or apricot juice each morning.  o Eat two (2) large servings of stewed fruit each day.  o Take one (1) heaping tablespoon of a psyllium-based bulking agent twice a day. Use sugar-free sweetener when possible to avoid excessive calories.  o Eat a normal breakfast.  o Set aside 15 minutes after breakfast to sit on the toilet, but do not strain to have a bowel movement.  o If you do not have a bowel movement by the third day, use an enema and repeat the above steps.    Controlling diarrhea o Switch to liquids and simpler foods for a few days to avoid stressing your intestines further. o Avoid dairy products (especially milk & ice cream) for a short time.  The intestines often can lose the ability to  digest lactose when stressed. o Avoid foods that cause gassiness or bloating.  Typical foods include beans and other legumes, cabbage, broccoli, and dairy foods.  Every person has some sensitivity to other foods, so listen to our body and avoid those foods that trigger problems for you. o Adding fiber (Citrucel, Metamucil, psyllium, Miralax) gradually can help thicken stools by absorbing excess fluid and retrain the intestines to act more normally.  Slowly increase the dose over a few weeks.  Too much fiber too soon can backfire and cause cramping & bloating. o Probiotics (such as active yogurt, Align, etc) may help repopulate the intestines and colon with normal bacteria and calm down a sensitive digestive tract.  Most studies show it to be of mild help, though, and such products can be costly. o Medicines:   Bismuth subsalicylate (ex. Kayopectate, Pepto Bismol) every 30 minutes for up to 6 doses can help control diarrhea.  Avoid if  pregnant.   Loperamide (Immodium) can slow down diarrhea.  Start with two tablets (4mg  total) first and then try one tablet every 6 hours.  Avoid if you are having fevers or severe pain.  If you are not better or start feeling worse, stop all medicines and call your doctor for advice o Call your doctor if you are getting worse or not better.  Sometimes further testing (cultures, endoscopy, X-ray studies, bloodwork, etc) may be needed to help diagnose and treat the cause of the diarrhea. o   Bloating Bloating is the feeling of fullness in your belly. You may feel as though your pants are too tight. Often the cause of bloating is overeating, retaining fluids, or having gas in your bowel. It is also caused by swallowing air and eating foods that cause gas. Irritable bowel syndrome is one of the most common causes of bloating. Constipation is also a common cause. Sometimes more serious problems can cause bloating. SYMPTOMS  Usually there is a feeling of fullness, as though your abdomen is bulged out. There may be mild discomfort.  DIAGNOSIS  Usually no particular testing is necessary for most bloating. If the condition persists and seems to become worse, your caregiver may do additional testing.  TREATMENT   There is no direct treatment for bloating.  Do not put gas into the bowel. Avoid chewing gum and sucking on candy. These tend to make you swallow air. Swallowing air can also be a nervous habit. Try to avoid this.  Avoiding high residue diets will help. Eat foods with soluble fibers (examples include root vegetables, apples, or barley) and substitute dairy products with soy and rice products. This helps irritable bowel syndrome.  If constipation is the cause, then a high residue diet with more fiber will help.  Avoid carbonated beverages.  Over-the-counter preparations are available that help reduce gas. (GAS-X, MAALOX, PHA ZYME) Your pharmacist can help you with this. SEEK MEDICAL CARE IF:     Bloating continues and seems to be getting worse.  You notice a weight gain.  You have a weight loss but the bloating is getting worse.  You have changes in your bowel habits or develop nausea or vomiting. SEEK IMMEDIATE MEDICAL CARE IF:   You develop shortness of breath or swelling in your legs.  You have an increase in abdominal pain or develop chest pain. Document Released: 01/23/2006 Document Revised: 06/17/2011 Document Reviewed: 03/13/2007 [facility] Patient Information 2014 [place].

## 2013-08-02 ENCOUNTER — Encounter (HOSPITAL_COMMUNITY): Payer: Self-pay | Admitting: Gastroenterology

## 2013-08-02 ENCOUNTER — Inpatient Hospital Stay (HOSPITAL_COMMUNITY): Payer: Medicaid - Out of State

## 2013-08-02 LAB — COMPREHENSIVE METABOLIC PANEL
ALBUMIN: 3.4 g/dL — AB (ref 3.5–5.2)
ALT: 59 U/L — ABNORMAL HIGH (ref 0–35)
AST: 38 U/L — AB (ref 0–37)
Alkaline Phosphatase: 169 U/L — ABNORMAL HIGH (ref 39–117)
BUN: 17 mg/dL (ref 6–23)
CO2: 25 mEq/L (ref 19–32)
CREATININE: 0.63 mg/dL (ref 0.50–1.10)
Calcium: 9.7 mg/dL (ref 8.4–10.5)
Chloride: 100 mEq/L (ref 96–112)
GFR calc Af Amer: >90 mL/min (ref >90)
GFR, EST NON AFRICAN AMERICAN: 82 mL/min — AB (ref >90)
Glucose, Bld: 119 mg/dL — ABNORMAL HIGH (ref 70–99)
Potassium: 4.3 mEq/L (ref 3.7–5.3)
Sodium: 137 mEq/L (ref 137–147)
TOTAL PROTEIN: 7.1 g/dL (ref 6.0–8.3)
Total Bilirubin: 0.4 mg/dL (ref 0.3–1.2)

## 2013-08-02 LAB — CANCER ANTIGEN 19-9: CA 19-9: 152.6 U/mL — ABNORMAL HIGH (ref <35.0)

## 2013-08-02 LAB — GLUCOSE, CAPILLARY: Glucose-Capillary: 117 mg/dL — ABNORMAL HIGH (ref 70–99)

## 2013-08-02 MED ORDER — SIMETHICONE 80 MG PO CHEW: 80.0000 mg | CHEWABLE_TABLET | Freq: Four times a day (QID) | ORAL | Status: AC | PRN

## 2013-08-02 MED ORDER — DSS 100 MG PO CAPS: 100.0000 mg | ORAL_CAPSULE | Freq: Two times a day (BID) | ORAL | Status: AC

## 2013-08-02 MED ORDER — ONDANSETRON HCL 4 MG PO TABS: 4.0000 mg | ORAL_TABLET | Freq: Four times a day (QID) | ORAL | Status: AC | PRN

## 2013-08-02 MED ORDER — HYDROCODONE-ACETAMINOPHEN 5-325 MG PO TABS: 1.0000 | ORAL_TABLET | ORAL | Status: DC | PRN

## 2013-08-02 MED ORDER — IOHEXOL 300 MG/ML  SOLN
100.0000 mL | Freq: Once | INTRAMUSCULAR | Status: AC | PRN
  Administered 2013-08-02: 100 mL via INTRAVENOUS

## 2013-08-02 NOTE — Discharge Summary (Signed)
Physician Discharge Summary  Castella Postlethwaite [id number] DOB: 06/22/1929 DOA: 07/29/2013  PCP: Pcp Not In System  Admit date: 07/29/2013 Discharge date: 08/02/2013  Time spent: 35 minutes  Recommendations for Outpatient Follow-up:  1. Family wishing to take patient back to home state of [place] 2. Needs to establish with PCP 3. Made appointment with Dr. Barry Dienes in case patient wants surgery in [place]  Discharge Diagnoses:  Principal Problem:   Abdominal pain Active Problems:   Elevated LFTs   Nausea and vomiting   HTN (hypertension)   Dyslipidemia   Vertigo   Mass of ampulla of Vater   Neck pain   Discharge Condition: stable  Diet recommendation: regular  Filed Weights   07/30/13 0900 07/31/13 0525 08/01/13 0602  Weight: 51.9 kg (114 lb 6.7 oz) 53.7 kg (118 lb 6.2 oz) 54.1 kg (119 lb 4.3 oz)    History of present illness:  80 year old female with past medical history of hypertension, dyslpidemia, choledocholelithiasis and status post lap chole 03/16/2013 who presented to [facility] ED from UC due to complaints of feeling dizzy, weak and with ongoing nausea, vomiting and periumbilical pain for past few days prior to this admission. Pain is 10/10 in mid abdomen and not radiating. Her pain is intermittent and sharp and not relieved by laying down. She reported no blood in emesis. No fever or chills. NO chest pain and palpitations or shortness of breath. No reports of blood in stool. No diarrhea or constipation. She did endorse having low back pain which radiated to the left side of the leg. No complaints of joint swelling or falls. She also reported having headache which was associated with episode of vomiting and dizziness.  In ED, BP was 114/55, 196/75 and 165/74. HR was 63 Tmax was 97.6 F and oxygen saturation was 92% on room air. Blood work was unremarkable except for elevated LFT's AST 142, ALT 104 and ALP 235, normal bilirubin. Please note that her LFT's were chronically elevated and most  recently in 03/2013 AST was 259, ALT 340 and ALP 417. CT abdomen was significant for possible tumor at the ampullary region. In addition, she continued to have dizziness and was given a dose of meclizine and her dizziness has improved. She was also given zofran and morphine   Hospital Course:  Abdominal pain, nausea and vomiting  -path back as adenocardcinoma  -stent placed by GI after biopsies done  -appreciate surgery and GI  -multiple questions answered   HTN (hypertension)  - controlled   Dyslipidemia  - hold statin   Vertigo, dizziness  - resolved   Elevated LFTs  - chronically elevated as indicated above actually better since 03/2013; concern is obviously for possible tumor involvement in ampullary region  - decreasing after stent  - hold statin therapy   Low back pain  - no acute bony abnormalities on lumbar spine x ray  - some disc bulging noticeable on CT scan which causes narrowing which likely contributes to patient's low back pain  -resolved       Procedures:  ERCP  Consultations:  GI  surgery  Discharge Exam: Filed Vitals:   08/02/13 1401  BP: 123/67  Pulse: 86  Temp: 97.7 F (36.5 C)  Resp: 18    General: wanting to go home Cardiovascular: rrr Respiratory: clear  Discharge Instructions You were cared for by a hospitalist during your hospital stay. If you have any questions about your discharge medications or the care you received while you were in  the hospital after you are discharged, you can call the unit and asked to speak with the hospitalist on call if the hospitalist that took care of you is not available. Once you are discharged, your primary care physician will handle any further medical issues. Please note that NO REFILLS for any discharge medications will be authorized once you are discharged, as it is imperative that you return to your primary care physician (or establish a relationship with a primary care physician if you do not have  one) for your aftercare needs so that they can reassess your need for medications and monitor your lab values.  Discharge Orders   Future Appointments Provider Department Dept Phone   08/13/2013 9:45 AM Stark Klein, MD [facility], [place] [phone]   Future Orders Complete By Expires   Diet general  As directed    Discharge instructions  As directed    Increase activity slowly  As directed        Medication List    STOP taking these medications       rosuvastatin 5 MG tablet  Commonly known as:  CRESTOR     traMADol-acetaminophen 37.5-325 MG per tablet  Commonly known as:  ULTRACET      TAKE these medications       alendronate 70 MG tablet  Commonly known as:  FOSAMAX  Take 70 mg by mouth once a week. wednesdays     aspirin 81 MG tablet  Take 81 mg by mouth daily.     b complex vitamins tablet  Take 1 tablet by mouth daily.     calcium-vitamin D 500-200 MG-UNIT per tablet  Commonly known as:  OSCAL WITH D  Take 1 tablet by mouth 2 (two) times daily.     DSS 100 MG Caps  Take 100 mg by mouth 2 (two) times daily.     HYDROcodone-acetaminophen 5-325 MG per tablet  Commonly known as:  NORCO/VICODIN  Take 1-2 tablets by mouth every 4 (four) hours as needed for moderate pain.     Iron 325 (65 FE) MG Tabs  Take 1 tablet by mouth once a week. fridays     moexipril-hydrochlorothiazide 7.5-12.5 MG per tablet  Commonly known as:  UNIRETIC  Take 1 tablet by mouth daily.     ondansetron 4 MG tablet  Commonly known as:  ZOFRAN  Take 1 tablet (4 mg total) by mouth every 6 (six) hours as needed for nausea.     simethicone 80 MG chewable tablet  Commonly known as:  MYLICON  Chew 1 tablet (80 mg total) by mouth every 6 (six) hours as needed for flatulence.       Allergies  Allergen Reactions  . Penicillins     Does not remember       Follow-up Information   Follow up with [facility], MD In 10 days. (To follow up after your hospital stay & see if you  may need surgery for your ampullary mass/swelling)    Specialty:  General Surgery   Contact information:   [place] [address] [phone]       Call Beryle Beams, MD. (Check to see how the bile duct stent needs to be followed)    Specialty:  Gastroenterology   Contact information:   [address] [place] [address] [phone]       Follow up with Stark Klein, MD. (fri 8th- at 9:45- be 20 min early)    Specialty:  General Surgery   Contact information:   [address] [place] [address] [phone]        The results of significant diagnostics from this hospitalization (including imaging, microbiology, ancillary and laboratory) are listed below for reference.    Significant Diagnostic Studies: Dg Cervical Spine Complete  07/29/2013   CLINICAL DATA:  Left neck pain  EXAM: CERVICAL SPINE - COMPLETE 4+ VIEW  COMPARISON:  None.  FINDINGS: Reversed cervical lordosis. Severe narrowing at C5-6 and C6-7 discs with posterior osteophytic ridging. Prevertebral soft tissues are unremarkable. Foramina grossly patent. Odontoid is grossly intact.  IMPRESSION: No acute bony pathology.  Degenerative change.   Electronically Signed   By: Maryclare Bean M.D.   On: 07/29/2013 18:49   Dg Lumbar Spine Complete  07/29/2013   CLINICAL DATA:  Low back pain  EXAM: LUMBAR SPINE - COMPLETE 4+ VIEW  COMPARISON:  None.  FINDINGS: Osteopenia. No vertebral compression deformity. Moderate disc space narrowing at L1-2, L2-3, and L3-4. Facet arthropathy at L5-S1.  IMPRESSION: No acute bony pathology.  Chronic changes.   Electronically Signed   By: Maryclare Bean M.D.   On: 07/29/2013 18:47   Dg Abd 1 View  07/29/2013   CLINICAL DATA:  Bloating  EXAM: ABDOMEN - 1 VIEW  COMPARISON:  None.  FINDINGS: No disproportionate dilatation of small bowel. No obvious free intraperitoneal gas. Degenerative changes in the lumbar spine.  IMPRESSION: Nonobstructive  bowel gas pattern.   Electronically Signed   By: Maryclare Bean M.D.   On: 07/29/2013 18:46   US Abdomen Complete  07/30/2013   CLINICAL DATA:  Abnormal liver function studies.  EXAM: ULTRASOUND ABDOMEN COMPLETE  COMPARISON:  CT ABD/PELVIS W CM dated 07/29/2013; DG ABD 1 VIEW dated 07/29/2013; DG CHOLANGIOGRAM OPERATIVE dated 03/16/2013  FINDINGS: Gallbladder:  Surgical absence of the gallbladder.  Common bile duct:  Diameter: 16 mm, dilated. No intraluminal filling defects demonstrated.  Liver:  No focal lesion identified. Within normal limits in parenchymal echogenicity.  IVC:  No abnormality visualized.  Pancreas:  Not visualized due to overlying bowel gas.  Spleen:  Size and appearance within normal limits.  Right Kidney:  Length: 9.7 cm. Cyst in the lower pole measuring 2.3 cm maximal diameter. This was also demonstrated on the previous CT scan.  Left Kidney:  Length: 10.2 cm. Mild prominence of collecting system. No parenchymal mass or atrophy.  Abdominal aorta:  No aneurysm visualized.  Other findings:  None.  IMPRESSION: Previous cholecystitis. Bile duct dilatation as demonstrated on previous CT.   Electronically Signed   By: Lucienne Capers M.D.   On: 07/30/2013 00:44   Ct Abdomen Pelvis W Contrast  07/29/2013   CLINICAL DATA:  Low back pain and abdominal pain with nausea for the last 4 days. Headache, dizziness, numbness in the hands starting this morning.  EXAM: CT ABDOMEN AND PELVIS WITH CONTRAST  TECHNIQUE: Multidetector CT imaging of the abdomen and pelvis was performed using the standard protocol following bolus administration of intravenous contrast.  CONTRAST:  62mL OMNIPAQUE IOHEXOL 300 MG/ML  SOLN  COMPARISON:  DG ABD 1 VIEW dated 07/29/2013; DG LUMBAR SPINE COMPLETE dated 07/29/2013  FINDINGS: Small subpleural nodular opacities are demonstrated in the lung bases. This could represent inflammatory process although three-month follow-up is recommended to exclude persistent lesion. Surgical absence of  the gallbladder. Moderate intra and extrahepatic bile duct dilatation down to the level of the ampulla. There is a focal soft tissue mass demonstrated at  the ampulla measuring 1.6 cm diameter, concerning for an ampullary tumor. Endoscopy recommended for further evaluation. No radiopaque common duct stones are visualized. No focal liver lesions are demonstrated. The pancreas, spleen, adrenal glands, abdominal aorta, inferior vena cava, and retroperitoneal lymph nodes are unremarkable. Parenchymal cyst in the right kidney and parapelvic cysts in both kidneys. No solid mass or hydronephrosis in the kidneys. The stomach, small bowel, and colon are not abnormally distended. No free air or free fluid in the abdomen. Abdominal wall musculature appears intact.  Pelvis: Appendix is normal. Uterus and ovaries are not enlarged. No free or loculated pelvic fluid collections. No inflammatory changes. No significant pelvic lymphadenopathy. Degenerative changes in the lumbar spine. Degenerative changes in the facet joints. Normal alignment. Disc bulge/ protrusions at L2-3, L3-4, and L4-5 appear to cause central canal narrowing.  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 Nonspecific subpleural nodular opacities in the lung bases. Degenerative changes in the lumbar spine with probable multilevel disc disease causing central canal narrowing.   Electronically Signed   By: Lucienne Capers M.D.   On: 07/29/2013 22:18   Dg Ercp  07/30/2013   CLINICAL DATA:  Ampullary mass  EXAM: ERCP  TECHNIQUE: Multiple spot images obtained with the fluoroscopic device and submitted for interpretation post-procedure.  COMPARISON:  None.  FINDINGS: Three spot films were obtained during an ERCP. The initial image show some contrast within the common bile duct which is dilated. A plastic stent was then placed across the obstructive change. 50 seconds of fluoroscopy  was utilized.   Electronically Signed   By: Inez Catalina M.D.   On: 07/30/2013 14:08    Microbiology: No results found for this or any previous visit (from the past 240 hour(s)).   Labs: Basic Metabolic Panel:  Recent Labs Lab 07/29/13 1534 07/30/13 0153 07/31/13 0613 08/01/13 0340 08/02/13 0518  NA 139 139 141 140 137  K 4.4 3.7 4.1 4.1 4.3  CL 103 103 106 102 100  CO2 23 24 22 24 25   GLUCOSE 139* 103* 89 104* 119*  BUN 23 14 17 16 17   CREATININE 0.54 0.52 0.60 0.59 0.63  CALCIUM 9.1 8.6 8.7 9.2 9.7   Liver Function Tests:  Recent Labs Lab 07/29/13 1534 07/30/13 0153 07/31/13 0613 08/01/13 0340 08/02/13 0518  AST 142* 210* 83* 47* 38*  ALT 104* 131* 95* 74* 59*  ALKPHOS 235* 229* 204* 185* 169*  BILITOT 0.8 1.1 0.5 0.4 0.4  PROT 7.5 6.7 6.4 6.9 7.1  ALBUMIN 3.7 3.3* 3.1* 3.3* 3.4*   No results found for this basename: LIPASE, AMYLASE,  in the last 168 hours No results found for this basename: AMMONIA,  in the last 168 hours CBC:  Recent Labs Lab 07/29/13 1534 07/30/13 0153 07/31/13 0613  WBC 7.4 6.2 6.8  NEUTROABS 4.9  --   --   HGB 12.5 12.0 11.4*  HCT 37.5 36.4 36.2  MCV 79.1 79.1 81.5  PLT 242 245 240   Cardiac Enzymes: No results found for this basename: CKTOTAL, CKMB, CKMBINDEX, TROPONINI,  in the last 168 hours BNP: BNP (last 3 results) No results found for this basename: PROBNP,  in the last 8760 hours CBG:  Recent Labs Lab 07/30/13 0800 07/31/13 0729 08/01/13 0722 08/02/13 0800  GLUCAP 96 96 131* 117*       Signed:  [name]  [facility] 08/02/2013, 4:44 PM

## 2013-08-02 NOTE — Progress Notes (Signed)
Patient ID: Vicki Chavez, female   DOB: 01/31/1930, 80 y.o.   MRN: [id number]   Subjective: No nausea today.  Had sharp pain after meals along with bloating.  Passing flatus.   Objective:  Vital signs:  Filed Vitals:   08/01/13 0602 08/01/13 1339 08/01/13 2112 08/02/13 0551  BP: 122/56 130/60 130/58 108/58  Pulse: 87 77 84 75  Temp: 97.7 F (36.5 C) 98 F (36.7 C) 98.2 F (36.8 C) 98.5 F (36.9 C)  TempSrc: Oral Oral Oral   Resp: 19 19 18 16  Height:      Weight: 119 lb 4.3 oz (54.1 kg)     SpO2: 96% 97% 94% 99%       Intake/Output   Yesterday:  04/26 0701 - 04/27 0700 In: 840 [P.O.:840] Out: -  This shift:    I/O last 3 completed shifts: In: 1200 [P.O.:1200] Out: -    Physical Exam: General: Pt awake/alert/oriented x4 in no acute distress Abdomen: Soft.  Nondistended.  ttp to luq.   No evidence of peritonitis.  No incarcerated hernias.   Problem List:   Principal Problem:   Abdominal pain Active Problems:   Elevated LFTs   Nausea and vomiting   HTN (hypertension)   Dyslipidemia   Vertigo   Mass of ampulla of Vater   Neck pain    Results:   Labs: Results for orders placed during the hospital encounter of 07/29/13 (from the past 48 hour(s))  COMPREHENSIVE METABOLIC PANEL     Status: Abnormal   Collection Time    08/01/13  3:40 AM      Result Value Ref Range   Sodium 140  137 - 147 mEq/L   Potassium 4.1  3.7 - 5.3 mEq/L   Chloride 102  96 - 112 mEq/L   CO2 24  19 - 32 mEq/L   Glucose, Bld 104 (*) 70 - 99 mg/dL   BUN 16  6 - 23 mg/dL   Creatinine, Ser 0.59  0.50 - 1.10 mg/dL   Calcium 9.2  8.4 - 10.5 mg/dL   Total Protein 6.9  6.0 - 8.3 g/dL   Albumin 3.3 (*) 3.5 - 5.2 g/dL   AST 47 (*) 0 - 37 U/L   ALT 74 (*) 0 - 35 U/L   Alkaline Phosphatase 185 (*) 39 - 117 U/L   Total Bilirubin 0.4  0.3 - 1.2 mg/dL   GFR calc non Af Amer 84 (*) >90 mL/min   GFR calc Af Amer >90  >90 mL/min   Comment: (NOTE)     The eGFR has been calculated using the  CKD EPI equation.     This calculation has not been validated in all clinical situations.     eGFR's persistently <90 mL/min signify possible Chronic Kidney     Disease.  GLUCOSE, CAPILLARY     Status: Abnormal   Collection Time    08/01/13  7:22 AM      Result Value Ref Range   Glucose-Capillary 131 (*) 70 - 99 mg/dL  COMPREHENSIVE METABOLIC PANEL     Status: Abnormal   Collection Time    08/02/13  5:18 AM      Result Value Ref Range   Sodium 137  137 - 147 mEq/L   Potassium 4.3  3.7 - 5.3 mEq/L   Chloride 100  96 - 112 mEq/L   CO2 25  19 - 32 mEq/L   Glucose, Bld 119 (*) 70 - 99 mg/dL   BUN   17  6 - 23 mg/dL   Creatinine, Ser 0.63  0.50 - 1.10 mg/dL   Calcium 9.7  8.4 - 10.5 mg/dL   Total Protein 7.1  6.0 - 8.3 g/dL   Albumin 3.4 (*) 3.5 - 5.2 g/dL   AST 38 (*) 0 - 37 U/L   ALT 59 (*) 0 - 35 U/L   Alkaline Phosphatase 169 (*) 39 - 117 U/L   Total Bilirubin 0.4  0.3 - 1.2 mg/dL   GFR calc non Af Amer 82 (*) >90 mL/min   GFR calc Af Amer >90  >90 mL/min   Comment: (NOTE)     The eGFR has been calculated using the CKD EPI equation.     This calculation has not been validated in all clinical situations.     eGFR's persistently <90 mL/min signify possible Chronic Kidney     Disease.  GLUCOSE, CAPILLARY     Status: Abnormal   Collection Time    08/02/13  8:00 AM      Result Value Ref Range   Glucose-Capillary 117 (*) 70 - 99 mg/dL    Imaging / Studies: No results found.  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    

## 2013-08-02 NOTE — Progress Notes (Signed)
PROGRESS NOTE  Vicki Chavez ZTI:[id number] DOB: Sep 02, 1929 DOA: 07/29/2013 PCP: Pcp Not In System  Assessment/Plan: Abdominal pain, nausea and vomiting  -path back as adenocardcinoma -stent placed by GI after biopsies done - continue supportive care with IV fluids, antiemetics and analgesia  -appreciate surgery and GI -multiple questions answered  HTN (hypertension)  - controlled  Dyslipidemia  - hold statin    Vertigo, dizziness  - resolved  Elevated LFTs  - chronically elevated as indicated above actually better since 03/2013; concern is obviously for possible tumor involvement in ampullary region  - decreasing after stent - hold statin therapy   Low back pain  - no acute bony abnormalities on lumbar spine x ray  - some disc bulging noticeable on CT scan which causes narrowing which likely contributes to patient's low back pain  -resolved     Code Status: full Family Communication: patient and daughter Disposition Plan: d/c once ok with surgery   Consultants:  GI  Procedures:  EGD- biopsy  ERCP   HPI/Subjective: Still having gas  Objective: Filed Vitals:   08/02/13 0551  BP: 108/58  Pulse: 75  Temp: 98.5 F (36.9 C)  Resp: 16    Intake/Output Summary (Last 24 hours) at 08/02/13 1320 Last data filed at 08/01/13 1822  Gross per 24 hour  Intake    480 ml  Output      0 ml  Net    480 ml   Filed Weights   07/30/13 0900 07/31/13 0525 08/01/13 0602  Weight: 51.9 kg (114 lb 6.7 oz) 53.7 kg (118 lb 6.2 oz) 54.1 kg (119 lb 4.3 oz)    Exam:   General:  Up walking hallway  Cardiovascular: rrr  Respiratory: clear anterior  Abdomen: +BS, soft  Musculoskeletal: moves all 4 ext  Data Reviewed: Basic Metabolic Panel:  Recent Labs Lab 07/29/13 1534 07/30/13 0153 07/31/13 0613 08/01/13 0340 08/02/13 0518  NA 139 139 141 140 137  K 4.4 3.7 4.1 4.1 4.3  CL 103 103 106 102 100  CO2 23 24 22 24 25   GLUCOSE 139* 103* 89 104* 119*  BUN  23 14 17 16 17   CREATININE 0.54 0.52 0.60 0.59 0.63  CALCIUM 9.1 8.6 8.7 9.2 9.7   Liver Function Tests:  Recent Labs Lab 07/29/13 1534 07/30/13 0153 07/31/13 0613 08/01/13 0340 08/02/13 0518  AST 142* 210* 83* 47* 38*  ALT 104* 131* 95* 74* 59*  ALKPHOS 235* 229* 204* 185* 169*  BILITOT 0.8 1.1 0.5 0.4 0.4  PROT 7.5 6.7 6.4 6.9 7.1  ALBUMIN 3.7 3.3* 3.1* 3.3* 3.4*   No results found for this basename: LIPASE, AMYLASE,  in the last 168 hours No results found for this basename: AMMONIA,  in the last 168 hours CBC:  Recent Labs Lab 07/29/13 1534 07/30/13 0153 07/31/13 0613  WBC 7.4 6.2 6.8  NEUTROABS 4.9  --   --   HGB 12.5 12.0 11.4*  HCT 37.5 36.4 36.2  MCV 79.1 79.1 81.5  PLT 242 245 240   Cardiac Enzymes: No results found for this basename: CKTOTAL, CKMB, CKMBINDEX, TROPONINI,  in the last 168 hours BNP (last 3 results) No results found for this basename: PROBNP,  in the last 8760 hours CBG:  Recent Labs Lab 07/30/13 0800 07/31/13 0729 08/01/13 0722 08/02/13 0800  GLUCAP 96 96 131* 117*    No results found for this or any previous visit (from the past 240 hour(s)).   Studies: No results found.  Scheduled Meds: . aspirin  81 mg Oral Daily  . calcium-vitamin D  1 tablet Oral BID WC  . docusate sodium  100 mg Oral BID  . trandolapril  1 mg Oral Daily   And  . hydrochlorothiazide  12.5 mg Oral Daily  . lip balm  1 application Topical BID  .  morphine injection  4 mg Intravenous Once  . saccharomyces boulardii  250 mg Oral BID  . sodium chloride  3 mL Intravenous Q12H   Continuous Infusions:   Antibiotics Given (last 72 hours)   None      Principal Problem:   Abdominal pain Active Problems:   Elevated LFTs   Nausea and vomiting   HTN (hypertension)   Dyslipidemia   Vertigo   Mass of ampulla of Vater   Neck pain    Time spent: 25 min    Vicki Chavez  [facility] Pager [phone] If 7PM-7AM, please contact night-coverage  at [email or web address], password [facility] 08/02/2013, 1:20 PM  LOS: 4 days

## 2013-08-02 NOTE — Progress Notes (Signed)
Vicki Chavez to be D/C'd Home per MD order after returning from CT scan.  Dayshift RN Derald Macleod went over paperwork with patient and family. All questions fully answered. IV removed with site clean, dry, intact.  Patient escorted via [place], and D/C home via private auto.  Micki Riley 08/02/2013 8:06 PM

## 2013-08-03 ENCOUNTER — Telehealth (INDEPENDENT_AMBULATORY_CARE_PROVIDER_SITE_OTHER): Payer: Self-pay

## 2013-08-03 NOTE — Telephone Encounter (Signed)
Family calling for results of CT of the chest.  Pt has a new patient appointment with Dr. Barry Dienes on 08/13/13.  This appt cannot be moved up.  No diagnosis will be given to the family until patient is seen in consult by Dr. Barry Dienes and all records are reviewed.  Will ask Kalman Shan, NP or Dr. Rosendo Gros to call this patient to go over results of the CT scan.  Family is aware.  Pls call [phone] and ask to speak to Adora Fridge, patient's daughter.

## 2013-08-04 ENCOUNTER — Telehealth (INDEPENDENT_AMBULATORY_CARE_PROVIDER_SITE_OTHER): Payer: Self-pay | Admitting: General Surgery

## 2013-08-04 NOTE — Telephone Encounter (Signed)
Daughter was calling back regarding the message below.  I advised that there was already a note in here to [facility] and Dr. Rosendo Gros.  I advised I would forward this to them again and that the only thing I could advise is to wait for a phone call.  Anderson Malta

## 2013-08-04 NOTE — Telephone Encounter (Signed)
No notes in encounter. 

## 2013-08-04 NOTE — Telephone Encounter (Signed)
Results discussed with her daughter Mrs. Singh.  Questions answered to the best of my ability.  She will follow up with Dr. Barry Dienes on Friday may 8th.  Brunetta Newingham, ANP-BC

## 2013-08-13 ENCOUNTER — Encounter (INDEPENDENT_AMBULATORY_CARE_PROVIDER_SITE_OTHER): Payer: PRIVATE HEALTH INSURANCE | Admitting: General Surgery

## 2014-10-20 ENCOUNTER — Emergency Department (HOSPITAL_COMMUNITY): Payer: BLUE CROSS/BLUE SHIELD

## 2014-10-20 ENCOUNTER — Emergency Department (HOSPITAL_COMMUNITY)
Admission: EM | Admit: 2014-10-20 | Discharge: 2014-10-20 | Disposition: A | Discharge status: Home / Self Care | Payer: BLUE CROSS/BLUE SHIELD | Attending: Emergency Medicine | Admitting: Emergency Medicine

## 2014-10-20 ENCOUNTER — Encounter (HOSPITAL_COMMUNITY): Payer: Self-pay | Admitting: Emergency Medicine

## 2014-10-20 DIAGNOSIS — M810 Age-related osteoporosis without current pathological fracture: Secondary | ICD-10-CM | POA: Insufficient documentation

## 2014-10-20 DIAGNOSIS — C259 Malignant neoplasm of pancreas, unspecified: Secondary | ICD-10-CM | POA: Insufficient documentation

## 2014-10-20 DIAGNOSIS — E871 Hypo-osmolality and hyponatremia: Secondary | ICD-10-CM | POA: Insufficient documentation

## 2014-10-20 DIAGNOSIS — Z79899 Other long term (current) drug therapy: Secondary | ICD-10-CM | POA: Insufficient documentation

## 2014-10-20 DIAGNOSIS — Z9049 Acquired absence of other specified parts of digestive tract: Secondary | ICD-10-CM | POA: Diagnosis not present

## 2014-10-20 DIAGNOSIS — Z880 Allergy status to penicillin: Secondary | ICD-10-CM | POA: Insufficient documentation

## 2014-10-20 DIAGNOSIS — I10 Essential (primary) hypertension: Secondary | ICD-10-CM | POA: Insufficient documentation

## 2014-10-20 DIAGNOSIS — R1031 Right lower quadrant pain: Secondary | ICD-10-CM

## 2014-10-20 DIAGNOSIS — Z7982 Long term (current) use of aspirin: Secondary | ICD-10-CM | POA: Insufficient documentation

## 2014-10-20 DIAGNOSIS — R109 Unspecified abdominal pain: Secondary | ICD-10-CM | POA: Diagnosis present

## 2014-10-20 DIAGNOSIS — C799 Secondary malignant neoplasm of unspecified site: Secondary | ICD-10-CM

## 2014-10-20 HISTORY — DX: Malignant (primary) neoplasm, unspecified: C80.1

## 2014-10-20 HISTORY — DX: Malignant neoplasm of pancreas, unspecified: C25.9

## 2014-10-20 LAB — COMPREHENSIVE METABOLIC PANEL
ALBUMIN: 3.7 g/dL (ref 3.5–5.0)
ALT: 51 U/L (ref 14–54)
AST: 35 U/L (ref 15–41)
Alkaline Phosphatase: 143 U/L — ABNORMAL HIGH (ref 38–126)
Anion gap: 9 (ref 5–15)
BUN: 13 mg/dL (ref 6–20)
CO2: 25 mmol/L (ref 22–32)
Calcium: 9.5 mg/dL (ref 8.9–10.3)
Chloride: 93 mmol/L — ABNORMAL LOW (ref 101–111)
Creatinine, Ser: 0.84 mg/dL (ref 0.44–1.00)
GFR calc Af Amer: >60 mL/min (ref >60)
Glucose, Bld: 134 mg/dL — ABNORMAL HIGH (ref 65–99)
POTASSIUM: 4.6 mmol/L (ref 3.5–5.1)
SODIUM: 127 mmol/L — AB (ref 135–145)
Total Bilirubin: 0.3 mg/dL (ref 0.3–1.2)
Total Protein: 7.8 g/dL (ref 6.5–8.1)

## 2014-10-20 LAB — CBC
HCT: 39.5 % (ref 36.0–46.0)
Hemoglobin: 13.1 g/dL (ref 12.0–15.0)
MCH: 26.6 pg (ref 26.0–34.0)
MCHC: 33.2 g/dL (ref 30.0–36.0)
MCV: 80.1 fL (ref 78.0–100.0)
Platelets: 239 10*3/uL (ref 150–400)
RBC: 4.93 MIL/uL (ref 3.87–5.11)
RDW: 18.7 % — AB (ref 11.5–15.5)
WBC: 5.8 10*3/uL (ref 4.0–10.5)

## 2014-10-20 LAB — URINALYSIS, ROUTINE W REFLEX MICROSCOPIC
BILIRUBIN URINE: NEGATIVE
GLUCOSE, UA: NEGATIVE mg/dL
Hgb urine dipstick: NEGATIVE
Ketones, ur: NEGATIVE mg/dL
Leukocytes, UA: NEGATIVE
NITRITE: NEGATIVE
Protein, ur: NEGATIVE mg/dL
Specific Gravity, Urine: 1.002 — ABNORMAL LOW (ref 1.005–1.030)
Urobilinogen, UA: 0.2 mg/dL (ref 0.0–1.0)
pH: 6.5 (ref 5.0–8.0)

## 2014-10-20 LAB — I-STAT CG4 LACTIC ACID, ED
LACTIC ACID, VENOUS: 1.07 mmol/L (ref 0.5–2.0)
Lactic Acid, Venous: 2.25 mmol/L (ref 0.5–2.0)

## 2014-10-20 LAB — LIPASE, BLOOD: Lipase: 36 U/L (ref 22–51)

## 2014-10-20 MED ORDER — SODIUM CHLORIDE 0.9 % IV BOLUS (SEPSIS)
1000.0000 mL | Freq: Once | INTRAVENOUS | Status: AC
  Administered 2014-10-20: 1000 mL via INTRAVENOUS

## 2014-10-20 MED ORDER — HYDROMORPHONE HCL 1 MG/ML IJ SOLN
1.0000 mg | Freq: Once | INTRAMUSCULAR | Status: AC
  Administered 2014-10-20: 1 mg via INTRAVENOUS
  Filled 2014-10-20: qty 1

## 2014-10-20 MED ORDER — IOHEXOL 300 MG/ML  SOLN
100.0000 mL | Freq: Once | INTRAMUSCULAR | Status: AC | PRN
  Administered 2014-10-20: 100 mL via INTRAVENOUS

## 2014-10-20 MED ORDER — MORPHINE SULFATE 4 MG/ML IJ SOLN
4.0000 mg | Freq: Once | INTRAMUSCULAR | Status: DC
  Filled 2014-10-20: qty 1

## 2014-10-20 MED ORDER — HYDROMORPHONE HCL 1 MG/ML IJ SOLN
0.5000 mg | Freq: Once | INTRAMUSCULAR | Status: AC
  Administered 2014-10-20: 0.5 mg via INTRAVENOUS
  Filled 2014-10-20: qty 1

## 2014-10-20 MED ORDER — HYDROMORPHONE HCL 1 MG/ML IJ SOLN
1.0000 mg | Freq: Once | INTRAMUSCULAR | Status: DC
  Filled 2014-10-20: qty 1

## 2014-10-20 MED ORDER — ONDANSETRON HCL 4 MG/2ML IJ SOLN
4.0000 mg | Freq: Once | INTRAMUSCULAR | Status: AC
  Administered 2014-10-20: 4 mg via INTRAVENOUS
  Filled 2014-10-20: qty 2

## 2014-10-20 NOTE — ED Notes (Signed)
MD advises pt does not need to be on enteric precautions at this time.

## 2014-10-20 NOTE — ED Notes (Signed)
MD at bedside. 

## 2014-10-20 NOTE — ED Notes (Signed)
The patient was able to walk on her own to the restroom with no problems.

## 2014-10-20 NOTE — ED Notes (Signed)
Recliner chair provided for pt's family

## 2014-10-20 NOTE — ED Notes (Signed)
Pt's family reminded to have pt begin second bottle of contrast at 64

## 2014-10-20 NOTE — ED Notes (Signed)
Pt able to ambulate to restroom with assistance from RN and son.

## 2014-10-20 NOTE — ED Provider Notes (Signed)
10:02 AM care accepted from Dr. Dina Rich. Patient continues to appear well on exam. Has not been able to get the fax of her previous CT although unlikely that there are new findings on today's CT. Will plan for discharge home and recommend increase frequency versus inc dosage of immediate release oxycodone as needed for pain. Will recommend patient return for any worsening.   Clinical Impression 1. Right lower quadrant abdominal pain   2. Metastatic cancer   3. Hyponatremia      Pamella Pert, MD 10/20/14 [phone]

## 2014-10-20 NOTE — ED Notes (Signed)
Pt's family provided with beverages and [place] sandwich.

## 2014-10-20 NOTE — ED Notes (Signed)
MD at bedside updating patient/family 

## 2014-10-20 NOTE — ED Provider Notes (Signed)
CSN: [id number]     Arrival date & time 10/20/14  0016 History   This chart was scribed for  Vicki Hacker, MD by Altamease Oiler, ED Scribe. This patient was seen in room B18C/B18C and the patient's care was started at 1:11 AM.    Chief Complaint  Patient presents with  . Abdominal Pain  . Diarrhea    The history is provided by the patient and a relative. No language interpreter was used.   Vicki Chavez is a 81 y.o. female with PMHx of pancreatic cancer on chemotherapy `who presents to the Emergency Department complaining of increased right-sided abdominal pain with onset yesterday. Pt notes that she has ongoing right sided pain secondary to her diagnosis but this pain is much worse. She rates the pain 7/10 in severity. Associated symptoms include 11 episodes of diarrhea. She uses Miralax and Senna daily in order to stay regular but the diarrhea is new. Pt denies vomiting, hematochezia, chest pain, fever, and SOB. Last chemotherapy treatment was on 10/11/14. No recent abx use.  The pt is visiting from [place] where she lives with her daughter. She is primarily receiving chemotherapy in [place].  Patient reports that she has a known right ovarian mass related to her cancer.  Last CT was approx one month ago in [place].  Oncologist is Dr. Malva Cogan, [facility] [place].   Past Medical History  Diagnosis Date  . Hypertension   . High cholesterol   . Osteoporosis   . Prediabetes   . Gall stones   . Cancer   . Pancreatic cancer    Past Surgical History  Procedure Laterality Date  . Ercp N/A 03/14/2013    Procedure: ENDOSCOPIC RETROGRADE CHOLANGIOPANCREATOGRAPHY (ERCP),stone removal, possible sphincterotomy;  Surgeon: Missy Sabins, MD;  Location: [place];  Service: Endoscopy;  Laterality: N/A;  . Cholecystectomy N/A 03/16/2013    Procedure: LAPAROSCOPIC CHOLECYSTECTOMY WITH INTRAOPERATIVE CHOLANGIOGRAM;  Surgeon: Earnstine Regal, MD;  Location: [place];  Service: General;   Laterality: N/A;  . Esophagogastroduodenoscopy N/A 07/30/2013    Procedure: ESOPHAGOGASTRODUODENOSCOPY (EGD);  Surgeon: Beryle Beams, MD;  Location: [facility] ENDOSCOPY;  Service: Endoscopy;  Laterality: N/A;  . Ercp N/A 07/30/2013    Procedure: ENDOSCOPIC RETROGRADE CHOLANGIOPANCREATOGRAPHY (ERCP);  Surgeon: Beryle Beams, MD;  [facility] ENDOSCOPY;  Service: Endoscopy;  Laterality: N/A;   No family history on file. History  Substance Use Topics  . Smoking status: Never Smoker   . Smokeless tobacco: Not on file  . Alcohol Use: No   OB History    No data available     Review of Systems  Constitutional: Negative for fever.  Respiratory: Negative for chest tightness and shortness of breath.   Cardiovascular: Negative for chest pain.  Gastrointestinal: Positive for abdominal pain and diarrhea. Negative for nausea and vomiting.  Genitourinary: Negative for dysuria and hematuria.  Musculoskeletal: Negative for back pain.  Neurological: Negative for headaches.  Psychiatric/Behavioral: Negative for confusion.  All other systems reviewed and are negative.     Allergies  Oxaliplatin and Penicillins  Home Medications   Prior to Admission medications   Medication Sig Start Date End Date Taking? Authorizing Provider  aspirin 81 MG tablet Take 81 mg by mouth daily.   Yes Historical Provider, MD  calcium-vitamin D (OSCAL WITH D) 500-200 MG-UNIT per tablet Take 1 tablet by mouth 2 (two) times daily.   Yes Historical Provider, MD  docusate sodium 100 MG CAPS Take 100 mg by mouth 2 (two) times daily.  08/02/13  Yes [name], DO  HYDROcodone-acetaminophen (NORCO/VICODIN) 5-325 MG per tablet Take 1-2 tablets by mouth every 4 (four) hours as needed for moderate pain. 08/02/13  Yes Jessica U Vann, DO  ondansetron (ZOFRAN) 4 MG tablet Take 1 tablet (4 mg total) by mouth every 6 (six) hours as needed for nausea. 08/02/13  Yes [name], DO  simethicone (MYLICON) 80 MG chewable tablet Chew 1  tablet (80 mg total) by mouth every 6 (six) hours as needed for flatulence. 08/02/13  Yes [name], DO  alendronate (FOSAMAX) 70 MG tablet Take 70 mg by mouth once a week. [id number]    Historical Provider, MD  b complex vitamins tablet Take 1 tablet by mouth daily.    Historical Provider, MD  Ferrous Sulfate (IRON) 325 (65 FE) MG TABS Take 1 tablet by mouth once a week. [place] Provider, MD  moexipril-hydrochlorothiazide (UNIRETIC) 7.5-12.5 MG per tablet Take 1 tablet by mouth daily.    Historical Provider, MD   BP 197/98 mmHg  Pulse 108  Temp(Src) 97.6 F (36.4 C) (Oral)  Resp 18  SpO2 96% Physical Exam  Constitutional: She is oriented to person, place, and time.  Chronically ill-appearing, no acute distress  HENT:  Head: Normocephalic and atraumatic.  Alopecia  Cardiovascular: Normal rate, regular rhythm and normal heart sounds.   Pulmonary/Chest: Effort normal. No respiratory distress. She has no wheezes.  Abdominal: Soft. Bowel sounds are normal. She exhibits distension and mass. There is tenderness. There is no rebound and no guarding.  Right lower quadrant tenderness to palpation with mass  Neurological: She is alert and oriented to person, place, and time.  Skin: Skin is warm and dry.  Psychiatric: She has a normal mood and affect.  Nursing note and vitals reviewed.   ED Course  Procedures   CRITICAL CARE Performed by: Vicki Chavez   Total critical care time: 45 min  Critical care time was exclusive of separately billable procedures and treating other patients.  Critical care was necessary to treat or prevent imminent or life-threatening deterioration.  Critical care was time spent personally by me on the following activities: development of treatment plan with patient and/or surrogate as well as nursing, discussions with consultants, evaluation of patient's response to treatment, examination of patient, obtaining history from patient or  surrogate, ordering and performing treatments and interventions, ordering and review of laboratory studies, ordering and review of radiographic studies, pulse oximetry and re-evaluation of patient's condition.   DIAGNOSTIC STUDIES: Oxygen Saturation is 96% on RA, normal by my interpretation.    COORDINATION OF CARE: 1:17 AM Discussed treatment plan which includes CT A/P with IV contrast, lab work, and IVF with pt at bedside and pt agreed to plan.  Labs Review Labs Reviewed  COMPREHENSIVE METABOLIC PANEL - Abnormal; Notable for the following:    Sodium 127 (*)    Chloride 93 (*)    Glucose, Bld 134 (*)    Alkaline Phosphatase 143 (*)    All other components within normal limits  CBC - Abnormal; Notable for the following:    RDW 18.7 (*)    All other components within normal limits  URINALYSIS, ROUTINE W REFLEX MICROSCOPIC (NOT AT [facility]) - Abnormal; Notable for the following:    Specific Gravity, Urine 1.002 (*)    All other components within normal limits  I-STAT CG4 LACTIC ACID, ED - Abnormal; Notable for the following:    Lactic Acid, Venous 2.25 (*)  All other components within normal limits  LIPASE, BLOOD  I-STAT CG4 LACTIC ACID, ED    Imaging Review US Pelvis Complete  10/20/2014   CLINICAL DATA:  Follow-up CT. Cystic pelvic mass and expanded endometrium on prior CT scan.  EXAM: TRANSABDOMINAL ULTRASOUND OF PELVIS  TECHNIQUE: Transabdominal ultrasound examination of the pelvis was performed including evaluation of the uterus, ovaries, adnexal regions, and pelvic cul-de-sac.  COMPARISON:  CT abdomen and pelvis 10/20/2014  FINDINGS: Uterus  Measurements: 8 x 3.5 x 4.9 cm. No fibroids or other mass visualized.  Endometrium  Cystic nodule or collection within the endometrium measuring 3.7 x 2.3 x 3.3 cm. Consider cystic mass, polyp, or infection.  Right ovary  Measurements: 2.6 x 1.1 x 2.3 cm. Small ovarian cyst demonstrated measuring about 1.3 cm maximal dimension.  Left ovary  Left  ovary is not separately identified.  Other findings: Large complex multi-septated cystic mass demonstrated centrally in the pelvis, extending to the right adnexa and anterior to the uterus. Flow is demonstrated within some of the septations on color flow Doppler imaging. Cystic ovarian neoplasm should be excluded. Surgical consultation is suggested. No free fluid identified.  IMPRESSION: Cystic mass or collection within the endometrium. Large complex multi-septated cystic mass centrally in the pelvis. Neoplasm should be excluded and surgical consultation is suggested.   Electronically Signed   By: Lucienne Capers M.D.   On: 10/20/2014 06:52   Ct Abdomen Pelvis W Contrast  10/20/2014   CLINICAL DATA:  Generalized abdominal pain and diarrhea today. Elevated blood pressure at home.  EXAM: CT ABDOMEN AND PELVIS WITH CONTRAST  TECHNIQUE: Multidetector CT imaging of the abdomen and pelvis was performed using the standard protocol following bolus administration of intravenous contrast.  CONTRAST:  184mL OMNIPAQUE IOHEXOL 300 MG/ML  SOLN  COMPARISON:  07/29/2013  FINDINGS: Multiple nodules in the lung bases bilaterally with mild atelectasis in the lung bases. Small esophageal hiatal hernia.  Surgical absence of the gallbladder. Intra and extrahepatic bile duct dilatation with a common bile duct stent in place. The degree of bile duct dilatation is similar to the prior study. This may indicate poor stent function. Ampullary tumor seen previously is not as well visualized due to the presence of the stent across this area. No focal liver lesions. Pancreas, spleen, adrenal glands, abdominal aorta, and inferior vena cava are unremarkable. No significant retroperitoneal or mesenteric lymphadenopathy. There is right hydronephrosis and hydroureter extending to the level of L3-4. Delayed nephrogram on the right. Distal ureter is decompressed. This suggests interval development of ureteral obstruction. Cause of obstruction is not  identified. No radiopaque stones are seen. Obstruction could be due to extrinsic compression, stricture, or non radiopaque stone. Cysts again demonstrated in the right kidney. Left kidney appears normal. The stomach, small bowel, and colon are not abnormally distended. No discrete wall thickening is appreciated. No free air or free fluid in the abdomen.  Pelvis: Appendix is normal. Since the previous study, there is interval development of a large somewhat complex appearing cystic structure anteriorly in the pelvis. This probably represents a left adnexal cyst and measures about 9.1 by 8.1 cm. Ultrasound is suggested for further evaluation. In addition, the uterus is now retroflexed and there is cystic expansion of the endometrium measuring 2.4 cm diameter. Endometrial neoplasm or obstruction should be excluded. Again, ultrasound would be useful in further evaluation. The bladder is decompressed and displaced inferiorly by the left adnexal cystic mass. No significant lymphadenopathy in the pelvis. No free or loculated  pelvic fluid collections. Degenerative changes in the spine. No destructive bone lesions. Schmorl's nodes.  IMPRESSION: Persistent bile duct dilatation despite interval stenting. New right mid ureteral obstruction of uncertain etiology. New 9.1 cm complex cystic mass in the pelvis, likely arising from the left adnexa. New cystic expansion of the endometrium. Ultrasound pelvis is suggested for further evaluation. No evidence for bowel obstruction or inflammation.   Electronically Signed   By: Lucienne Capers M.D.   On: 10/20/2014 04:47     EKG Interpretation None      MDM   Final diagnoses:  None   Patient presents with abdominal pain and diarrhea.  Hx of metastatic pancreatic cancer.  Follows in [place].  Chronically ill appearing but non toxic.  Palpable mass on exam.  Fluids and pain medication ordered.  Labwork ordered.  Mild hyponatremia to 127 likely related to fluid loss.   Initial lactate 1.07 with repeat less than one hour later of 2.25 ? CLinical significance.  CT with persistent ductal dilation and R cystic mass in the pelvis.  We don't have recent CT to compare to; however, patient reported known mass in history.   Pelvic US better characterizes this.  Again, unclear if changed from known mass.  Patient's pain has been controlled in the ED.  I made multiple attempts to get outside records of recent imaging.  I requested a fax twice and was never received.  I discussed with the family and patient's her wishes.  I offered admission for pain control and fluids given hyponatremia.  Patient wishes to go home if possible.  SHe is requesting one more liter of fluids to "get strength."  Patient's and family's questions were addressed multiple times.    Patient signed out pending outpatient records and fluid administration.  IF records not received, patient is ok to leave on the assumption things are likely unchanged.  She is to call her oncologist later today.  I personally performed the services described in this documentation, which was scribed in my presence. The recorded information has been reviewed and is accurate.      Vicki Hacker, MD 10/20/14 1115

## 2014-10-20 NOTE — ED Notes (Signed)
C/o generalized abd pain and diarrhea x 11 today.  Family reports BP elevated at home.

## 2014-10-20 NOTE — ED Notes (Signed)
Pt returning from CT, in hall sts she is going to vomit.  Pt actively vomiting in room.

## 2014-10-22 ENCOUNTER — Emergency Department (HOSPITAL_COMMUNITY)
Admission: EM | Admit: 2014-10-22 | Discharge: 2014-10-23 | Disposition: A | Discharge status: Home / Self Care | Payer: BLUE CROSS/BLUE SHIELD | Attending: Emergency Medicine | Admitting: Emergency Medicine

## 2014-10-22 ENCOUNTER — Encounter (HOSPITAL_COMMUNITY): Payer: Self-pay | Admitting: Emergency Medicine

## 2014-10-22 DIAGNOSIS — Z8739 Personal history of other diseases of the musculoskeletal system and connective tissue: Secondary | ICD-10-CM | POA: Diagnosis not present

## 2014-10-22 DIAGNOSIS — I10 Essential (primary) hypertension: Secondary | ICD-10-CM | POA: Diagnosis not present

## 2014-10-22 DIAGNOSIS — Z880 Allergy status to penicillin: Secondary | ICD-10-CM | POA: Insufficient documentation

## 2014-10-22 DIAGNOSIS — Z8719 Personal history of other diseases of the digestive system: Secondary | ICD-10-CM | POA: Diagnosis not present

## 2014-10-22 DIAGNOSIS — C259 Malignant neoplasm of pancreas, unspecified: Secondary | ICD-10-CM | POA: Insufficient documentation

## 2014-10-22 DIAGNOSIS — R51 Headache: Secondary | ICD-10-CM | POA: Diagnosis not present

## 2014-10-22 DIAGNOSIS — E871 Hypo-osmolality and hyponatremia: Secondary | ICD-10-CM | POA: Insufficient documentation

## 2014-10-22 DIAGNOSIS — Z7982 Long term (current) use of aspirin: Secondary | ICD-10-CM | POA: Insufficient documentation

## 2014-10-22 LAB — COMPREHENSIVE METABOLIC PANEL
ALBUMIN: 3.3 g/dL — AB (ref 3.5–5.0)
ALT: 37 U/L (ref 14–54)
ANION GAP: 8 (ref 5–15)
AST: 32 U/L (ref 15–41)
Alkaline Phosphatase: 118 U/L (ref 38–126)
BILIRUBIN TOTAL: 0.2 mg/dL — AB (ref 0.3–1.2)
BUN: 8 mg/dL (ref 6–20)
CO2: 25 mmol/L (ref 22–32)
CREATININE: 0.69 mg/dL (ref 0.44–1.00)
Calcium: 9.4 mg/dL (ref 8.9–10.3)
Chloride: 97 mmol/L — ABNORMAL LOW (ref 101–111)
GFR calc Af Amer: >60 mL/min (ref >60)
GFR calc non Af Amer: >60 mL/min (ref >60)
Glucose, Bld: 151 mg/dL — ABNORMAL HIGH (ref 65–99)
Potassium: 4.4 mmol/L (ref 3.5–5.1)
Sodium: 130 mmol/L — ABNORMAL LOW (ref 135–145)
TOTAL PROTEIN: 7.2 g/dL (ref 6.5–8.1)

## 2014-10-22 LAB — CBC WITH DIFFERENTIAL/PLATELET
Basophils Absolute: 0 10*3/uL (ref 0.0–0.1)
Basophils Relative: 1 % (ref 0–1)
EOS PCT: 1 % (ref 0–5)
Eosinophils Absolute: 0.1 10*3/uL (ref 0.0–0.7)
HEMATOCRIT: 37 % (ref 36.0–46.0)
HEMOGLOBIN: 12.3 g/dL (ref 12.0–15.0)
LYMPHS PCT: 27 % (ref 12–46)
Lymphs Abs: 1.7 10*3/uL (ref 0.7–4.0)
MCH: 26.6 pg (ref 26.0–34.0)
MCHC: 33.2 g/dL (ref 30.0–36.0)
MCV: 80.1 fL (ref 78.0–100.0)
MONO ABS: 1.1 10*3/uL — AB (ref 0.1–1.0)
MONOS PCT: 17 % — AB (ref 3–12)
Neutro Abs: 3.5 10*3/uL (ref 1.7–7.7)
Neutrophils Relative %: 54 % (ref 43–77)
Platelets: 219 10*3/uL (ref 150–400)
RBC: 4.62 MIL/uL (ref 3.87–5.11)
RDW: 18.8 % — ABNORMAL HIGH (ref 11.5–15.5)
WBC: 6.4 10*3/uL (ref 4.0–10.5)

## 2014-10-22 MED ORDER — ATENOLOL 25 MG PO TABS
25.0000 mg | ORAL_TABLET | Freq: Once | ORAL | Status: AC
  Administered 2014-10-22: 25 mg via ORAL
  Filled 2014-10-22: qty 1

## 2014-10-22 MED ORDER — ACETAMINOPHEN 500 MG PO TABS
1000.0000 mg | ORAL_TABLET | ORAL | Status: AC
  Administered 2014-10-22: 1000 mg via ORAL
  Filled 2014-10-22: qty 2

## 2014-10-22 NOTE — ED Notes (Signed)
Pt. reports elevated blood pressure at home today 215/116 , with mild headache " pressure" , denies nausea , pt. took Atenolol prior to arrival , respirations unlabored .

## 2014-10-22 NOTE — ED Provider Notes (Signed)
CSN: [id number]     Arrival date & time 10/22/14  2233 History  This chart was scribed for Linton Flemings, MD by Eustaquio Maize, ED Scribe. This patient was seen in room B16C/B16C and the patient's care was started at 11:09 PM.    Chief Complaint  Patient presents with  . Hypertension   The history is provided by the patient and a relative. No language interpreter was used.     HPI Comments: Vicki Chavez is a 81 y.o. female with hx HTN, HLD, and pancreatic cancer on chemotherapy who presents to the Emergency Department complaining of high blood pressure that occurred earlier today. Pt states her BP was 754 systolic over 492 diastolic. She mentions that she had a headache that began around 3 PM (approximately 8 hours ago), prompting her to check her BP. She describes the headache as a heaviness and that it has worsened since being in the ED. Pt took blood pressure medication in the past but has not been on any in the past 6 months. She attempted to have her oncologist prescribed medication today but he was uncomfortable doing so. Pt was prescribed Atenolol today by the on call physician at her PCP's office. She states that she took a 25 mg Atenolol earlier today without relief. Pt took Tylenol and OxyContin prior to arrival. She also took Oxycodone earlier in the daytime. Pt was seen in the ED on 10/20/2014 (approximately 2 days ago) for abdominal pain and diarrhea. Family mentions that pt's blood pressure was high in the ED. They have been checking it every 4 hours since discharge and state it has not gone down. Pt has been taking Imodium for the diarrhea and reports that her symptoms have improved since. Denies chest pain, shortness of breath, numbness, tingling, or any other associated symptoms. Pt's last chemotherapy 10/11/2014. Her next scheduled appointment is Tuesday, 10/25/2014 (3 days from now).   Oncologist - Dr. Malva Cogan in [place], [place]  Past Medical History  Diagnosis Date  .  Hypertension   . High cholesterol   . Osteoporosis   . Prediabetes   . Gall stones   . Cancer   . Pancreatic cancer    Past Surgical History  Procedure Laterality Date  . Ercp N/A 03/14/2013    Procedure: ENDOSCOPIC RETROGRADE CHOLANGIOPANCREATOGRAPHY (ERCP),stone removal, possible sphincterotomy;  Surgeon: Missy Sabins, MD;  Location: [place];  Service: Endoscopy;  Laterality: N/A;  . Cholecystectomy N/A 03/16/2013    Procedure: LAPAROSCOPIC CHOLECYSTECTOMY WITH INTRAOPERATIVE CHOLANGIOGRAM;  Surgeon: Earnstine Regal, MD;  Location: [place];  Service: General;  Laterality: N/A;  . Esophagogastroduodenoscopy N/A 07/30/2013    Procedure: ESOPHAGOGASTRODUODENOSCOPY (EGD);  Surgeon: Beryle Beams, MD;  Location: [facility] ENDOSCOPY;  Service: Endoscopy;  Laterality: N/A;  . Ercp N/A 07/30/2013    Procedure: ENDOSCOPIC RETROGRADE CHOLANGIOPANCREATOGRAPHY (ERCP);  Surgeon: Beryle Beams, MD;  Location: [facility] ENDOSCOPY;  Service: Endoscopy;  Laterality: N/A;   No family history on file. History  Substance Use Topics  . Smoking status: Never Smoker   . Smokeless tobacco: Not on file  . Alcohol Use: No   OB History    No data available     Review of Systems  Respiratory: Negative for shortness of breath.   Cardiovascular: Negative for chest pain.  Neurological: Positive for headaches. Negative for numbness.  All other systems reviewed and are negative.   Allergies  Oxaliplatin and Penicillins  Home Medications   Prior to Admission medications   Medication Sig Start Date  End Date Taking? Authorizing Provider  acetaminophen (TYLENOL) 500 MG tablet Take 500 mg by mouth every 6 (six) hours as needed for moderate pain.    Historical Provider, MD  aspirin 81 MG tablet Take 81 mg by mouth daily.    Historical Provider, MD  calcium-vitamin D (OSCAL WITH D) 500-200 MG-UNIT per tablet Take 1 tablet by mouth 2 (two) times daily.    Historical Provider, MD  docusate sodium 100 MG CAPS Take 100 mg by  mouth 2 (two) times daily. Patient taking differently: Take 200 mg by mouth 2 (two) times daily.  08/02/13   [name], DO  HYDROcodone-acetaminophen (NORCO/VICODIN) 5-325 MG per tablet Take 1-2 tablets by mouth every 4 (four) hours as needed for moderate pain. Patient not taking: Reported on 10/20/2014 08/02/13   [name], DO  ondansetron (ZOFRAN) 4 MG tablet Take 1 tablet (4 mg total) by mouth every 6 (six) hours as needed for nausea. 08/02/13   [name], DO  oxyCODONE (OXY IR/ROXICODONE) 5 MG immediate release tablet Take 2.5-5 mg by mouth every 4 (four) hours as needed for severe pain (takes half tablet during the day takes whole tablet at bedtime).    Historical Provider, MD  OxyCODONE (OXYCONTIN) 10 mg T12A 12 hr tablet Take 10 mg by mouth at bedtime as needed (when unable to sleep).    Historical Provider, MD  simethicone (MYLICON) 80 MG chewable tablet Chew 1 tablet (80 mg total) by mouth every 6 (six) hours as needed for flatulence. 08/02/13   [name], DO   Triage Vitals: BP 178/95 mmHg  Pulse 87  Temp(Src) 97.5 F (36.4 C) (Oral)  Resp 16  SpO2 95%   Physical Exam  Constitutional: She is oriented to person, place, and time. She appears well-developed and well-nourished. No distress.  Thin female, chronically ill-appearing, no acute distress  HENT:  Head: Normocephalic and atraumatic.  Right Ear: External ear normal.  Left Ear: External ear normal.  Nose: Nose normal.  Mouth/Throat: Oropharynx is clear and moist.  Eyes: Conjunctivae and EOM are normal. Pupils are equal, round, and reactive to light.  Neck: Normal range of motion. Neck supple. No JVD present. No tracheal deviation present. No thyromegaly present.  Cardiovascular: Normal rate, regular rhythm, normal heart sounds and intact distal pulses.  Exam reveals no gallop and no friction rub.   No murmur heard. Pulmonary/Chest: Effort normal and breath sounds normal. No stridor. No respiratory distress.  She has no wheezes. She has no rales. She exhibits no tenderness.  Abdominal: Soft. Bowel sounds are normal. She exhibits no distension and no mass. There is no tenderness. There is no rebound and no guarding.  Musculoskeletal: Normal range of motion. She exhibits no edema or tenderness.  Lymphadenopathy:    She has no cervical adenopathy.  Neurological: She is alert and oriented to person, place, and time. She displays normal reflexes. No cranial nerve deficit. She exhibits normal muscle tone. Coordination normal.  Skin: Skin is warm and dry. No rash noted. No erythema. No pallor.  Psychiatric: She has a normal mood and affect. Her behavior is normal. Judgment and thought content normal.  Nursing note and vitals reviewed.    ED Course  Procedures (including critical care time)  DIAGNOSTIC STUDIES: Oxygen Saturation is 95% on RA, normal by my interpretation.    COORDINATION OF CARE: 11:32 PM-Discussed treatment plan which includes Tylenol and 25 mg Atenolol in ED with pt at bedside and pt agreed to  plan.   Labs Review Labs Reviewed  CBC WITH DIFFERENTIAL/PLATELET - Abnormal; Notable for the following:    RDW 18.8 (*)    Monocytes Relative 17 (*)    Monocytes Absolute 1.1 (*)    All other components within normal limits  COMPREHENSIVE METABOLIC PANEL    Imaging Review No results found.   EKG Interpretation None      MDM   Final diagnoses:  None    I personally performed the services described in this documentation, which was scribed in my presence. The recorded information has been reviewed and is accurate.  81 year old female, history of pancreatic cancer, currently undergoing chemotherapy in [place] presents to the emergency part with complaint of persistent elevated blood pressures.  Patient was in the emergency department 2 days ago with abdominal pain, during that workup, she was noted to have hyponatremia, and persistent abdominal mass.  Patient with mild  headache.  Neuro exam is normal, hyponatremia is improving.  No signs of end organ damage.  I have explained to the patient and the family.  She'll need to follow-up upon return to [place] this week with her primary care doctor.  They're to check blood pressures twice a day.  They've been giving red flags for return to the emergency room in.  Will increase atenolol to twice a day.  Patient with mild headache, but reports she often has headaches.  Will try Tylenol.     Linton Flemings, MD 10/23/14 [phone]

## 2014-10-23 MED ORDER — ATENOLOL 25 MG PO TABS: 25.0000 mg | ORAL_TABLET | Freq: Two times a day (BID) | ORAL | Status: AC

## 2014-10-23 NOTE — ED Notes (Signed)
Pt stable, ambulatory, states understanding of discharge instructions, family at bedside. 

## 2014-10-23 NOTE — Discharge Instructions (Signed)
DASH Eating Plan °DASH stands for "Dietary Approaches to Stop Hypertension." The DASH eating plan is a healthy eating plan that has been shown to reduce high blood pressure (hypertension). Additional health benefits may include reducing the risk of type 2 diabetes mellitus, heart disease, and stroke. The DASH eating plan may also help with weight loss. °WHAT DO I NEED TO KNOW ABOUT THE DASH EATING PLAN? °For the DASH eating plan, you will follow these general guidelines: °· Choose foods with a percent daily value for sodium of less than 5% (as listed on the food label). °· Use salt-free seasonings or herbs instead of table salt or sea salt. °· Check with your health care provider or pharmacist before using salt substitutes. °· Eat lower-sodium products, often labeled as "lower sodium" or "no salt added." °· Eat fresh foods. °· Eat more vegetables, fruits, and low-fat dairy products. °· Choose whole grains. Look for the word "whole" as the first word in the ingredient list. °· Choose fish and skinless chicken or turkey more often than red meat. Limit fish, poultry, and meat to 6 oz (170 g) each day. °· Limit sweets, desserts, sugars, and sugary drinks. °· Choose heart-healthy fats. °· Limit cheese to 1 oz (28 g) per day. °· Eat more home-cooked food and less restaurant, buffet, and fast food. °· Limit fried foods. °· Cook foods using methods other than frying. °· Limit canned vegetables. If you do use them, rinse them well to decrease the sodium. °· When eating at a restaurant, ask that your food be prepared with less salt, or no salt if possible. °WHAT FOODS CAN I EAT? °Seek help from a dietitian for individual calorie needs. °Grains °Whole grain or whole wheat bread. Brown rice. Whole grain or whole wheat pasta. Quinoa, bulgur, and whole grain cereals. Low-sodium cereals. Corn or whole wheat flour tortillas. Whole grain cornbread. Whole grain crackers. Low-sodium crackers. °Vegetables °Fresh or frozen vegetables  (raw, steamed, roasted, or grilled). Low-sodium or reduced-sodium tomato and vegetable juices. Low-sodium or reduced-sodium tomato sauce and paste. Low-sodium or reduced-sodium canned vegetables.  °Fruits °All fresh, canned (in natural juice), or frozen fruits. °Meat and Other Protein Products °Ground beef (85% or leaner), grass-fed beef, or beef trimmed of fat. Skinless chicken or turkey. Ground chicken or turkey. Pork trimmed of fat. All fish and seafood. Eggs. Dried beans, peas, or lentils. Unsalted nuts and seeds. Unsalted canned beans. °Dairy °Low-fat dairy products, such as skim or 1% milk, 2% or reduced-fat cheeses, low-fat ricotta or cottage cheese, or plain low-fat yogurt. Low-sodium or reduced-sodium cheeses. °Fats and Oils °Tub margarines without trans fats. Light or reduced-fat mayonnaise and salad dressings (reduced sodium). Avocado. Safflower, olive, or canola oils. Natural peanut or almond butter. °Other °Unsalted popcorn and pretzels. °The items listed above may not be a complete list of recommended foods or beverages. Contact your dietitian for more options. °WHAT FOODS ARE NOT RECOMMENDED? °Grains °White bread. White pasta. White rice. Refined cornbread. Bagels and croissants. Crackers that contain trans fat. °Vegetables °Creamed or fried vegetables. Vegetables in a cheese sauce. Regular canned vegetables. Regular canned tomato sauce and paste. Regular tomato and vegetable juices. °Fruits °Dried fruits. Canned fruit in light or heavy syrup. Fruit juice. °Meat and Other Protein Products °Fatty cuts of meat. Ribs, chicken wings, bacon, sausage, bologna, salami, chitterlings, fatback, hot dogs, bratwurst, and packaged luncheon meats. Salted nuts and seeds. Canned beans with salt. °Dairy °Whole or 2% milk, cream, half-and-half, and cream cheese. Whole-fat or sweetened yogurt. Full-fat   cheeses or blue cheese. Nondairy creamers and whipped toppings. Processed cheese, cheese spreads, or cheese  curds. Condiments Onion and garlic salt, seasoned salt, table salt, and sea salt. Canned and packaged gravies. Worcestershire sauce. Tartar sauce. Barbecue sauce. Teriyaki sauce. Soy sauce, including reduced sodium. Steak sauce. Fish sauce. Oyster sauce. Cocktail sauce. Horseradish. Ketchup and mustard. Meat flavorings and tenderizers. Bouillon cubes. Hot sauce. Tabasco sauce. Marinades. Taco seasonings. Relishes. Fats and Oils Butter, stick margarine, lard, shortening, ghee, and bacon fat. Coconut, palm kernel, or palm oils. Regular salad dressings. Other Pickles and olives. Salted popcorn and pretzels. The items listed above may not be a complete list of foods and beverages to avoid. Contact your dietitian for more information. WHERE CAN I FIND MORE INFORMATION? [employer], [employer], [employer]: travelstabl[employer] Document Released: 03/14/2011 Document Revised: 08/09/2013 Document Reviewed: 01/27/2013 [facility] Patient Information 2015 [place], [place]. This information is not intended to replace advice given to you by your health care provider. Make sure you discuss any questions you have with your health care provider.  How to Take Your Blood Pressure HOW DO I GET A BLOOD PRESSURE MACHINE?  You can buy an electronic home blood pressure machine at your local pharmacy. Insurance will sometimes cover the cost if you have a prescription.  Ask your doctor what type of machine is best for you. There are different machines for your arm and your wrist.  If you decide to buy a machine to check your blood pressure on your arm, first check the size of your arm so you can buy the right size cuff. To check the size of your arm:   Use a measuring tape that shows both inches and centimeters.   Wrap the measuring tape around the upper-middle part of your arm. You may need someone to help you measure.   Write down your arm measurement in both inches and  centimeters.   To measure your blood pressure correctly, it is important to have the right size cuff.   If your arm is up to 13 inches (up to 34 centimeters), get an adult cuff size.  If your arm is 13 to 17 inches (35 to 44 centimeters), get a large adult cuff size.    If your arm is 17 to 20 inches (45 to 52 centimeters), get an adult thigh cuff.  WHAT DO THE NUMBERS MEAN?   There are two numbers that make up your blood pressure. For example: 120/80.  The first number (120 in our example) is called the "systolic pressure." It is a measure of the pressure in your blood vessels when your heart is pumping blood.  The second number (80 in our example) is called the "diastolic pressure." It is a measure of the pressure in your blood vessels when your heart is resting between beats.  Your doctor will tell you what your blood pressure should be. WHAT SHOULD I DO BEFORE I CHECK MY BLOOD PRESSURE?   Try to rest or relax for at least 30 minutes before you check your blood pressure.  Do not smoke.  Do not have any drinks with caffeine, such as:  Soda.  Coffee.  Tea.  Check your blood pressure in a quiet room.  Sit down and stretch out your arm on a table. Keep your arm at about the level of your heart. Let your arm relax.  Make sure that your legs are not crossed. HOW DO I CHECK MY BLOOD PRESSURE?  Follow the directions that came with  your machine.  Make sure you remove any tight-fighting clothing from your arm or wrist. Wrap the cuff around your upper arm or wrist. You should be able to fit a finger between the cuff and your arm. If you cannot fit a finger between the cuff and your arm, it is too tight and should be removed and rewrapped.  Some units require you to manually pump up the arm cuff.  Automatic units inflate the cuff when you press a button.  Cuff deflation is automatic in both models.  After the cuff is inflated, the unit measures your blood pressure and  pulse. The readings are shown on a monitor. Hold still and breathe normally while the cuff is inflated.  Getting a reading takes less than a minute.  Some models store readings in a memory. Some provide a printout of readings. If your machine does not store your readings, keep a written record.  Take readings with you to your next visit with your doctor. Document Released: 03/07/2008 Document Revised: 08/09/2013 Document Reviewed: 05/20/2013 [facility] Patient Information 2015 [place], [place]. This information is not intended to replace advice given to you by your health care provider. Make sure you discuss any questions you have with your health care provider.  Managing Your High Blood Pressure Blood pressure is a measurement of how forceful your blood is pressing against the walls of the arteries. Arteries are muscular tubes within the circulatory system. Blood pressure does not stay the same. Blood pressure rises when you are active, excited, or nervous; and it lowers during sleep and relaxation. If the numbers measuring your blood pressure stay above normal most of the time, you are at risk for health problems. High blood pressure (hypertension) is a long-term (chronic) condition in which blood pressure is elevated. A blood pressure reading is recorded as two numbers, such as 120 over 80 (or 120/80). The first, higher number is called the systolic pressure. It is a measure of the pressure in your arteries as the heart beats. The second, lower number is called the diastolic pressure. It is a measure of the pressure in your arteries as the heart relaxes between beats.  Keeping your blood pressure in a normal range is important to your overall health and prevention of health problems, such as heart disease and stroke. When your blood pressure is uncontrolled, your heart has to work harder than normal. High blood pressure is a very common condition in adults because blood pressure tends to rise with age.  Men and women are equally likely to have hypertension but at different times in life. Before age 18, men are more likely to have hypertension. After 79 years of age, women are more likely to have it. Hypertension is especially common in African Americans. This condition often has no signs or symptoms. The cause of the condition is usually not known. Your caregiver can help you come up with a plan to keep your blood pressure in a normal, healthy range. BLOOD PRESSURE STAGES Blood pressure is classified into four stages: normal, prehypertension, stage 1, and stage 2. Your blood pressure reading will be used to determine what type of treatment, if any, is necessary. Appropriate treatment options are tied to these four stages:  Normal  Systolic pressure (mm Hg): below 120.  Diastolic pressure (mm Hg): below 80. Prehypertension  Systolic pressure (mm Hg): 120 to 139.  Diastolic pressure (mm Hg): 80 to 89. Stage1  Systolic pressure (mm Hg): 140 to 159.  Diastolic pressure (mm Hg):  90 to 99. Stage2  Systolic pressure (mm Hg): 160 or above.  Diastolic pressure (mm Hg): 100 or above. RISKS RELATED TO HIGH BLOOD PRESSURE Managing your blood pressure is an important responsibility. Uncontrolled high blood pressure can lead to:  A heart attack.  A stroke.  A weakened blood vessel (aneurysm).  Heart failure.  Kidney damage.  Eye damage.  Metabolic syndrome.  Memory and concentration problems. HOW TO MANAGE YOUR BLOOD PRESSURE Blood pressure can be managed effectively with lifestyle changes and medicines (if needed). Your caregiver will help you come up with a plan to bring your blood pressure within a normal range. Your plan should include the following: Education  Read all information provided by your caregivers about how to control blood pressure.  Educate yourself on the latest guidelines and treatment recommendations. New research is always being done to further define the  risks and treatments for high blood pressure. Lifestylechanges  Control your weight.  Avoid smoking.  Stay physically active.  Reduce the amount of salt in your diet.  Reduce stress.  Control any chronic conditions, such as high cholesterol or diabetes.  Reduce your alcohol intake. Medicines  Several medicines (antihypertensive medicines) are available, if needed, to bring blood pressure within a normal range. Communication  Review all the medicines you take with your caregiver because there may be side effects or interactions.  Talk with your caregiver about your diet, exercise habits, and other lifestyle factors that may be contributing to high blood pressure.  See your caregiver regularly. Your caregiver can help you create and adjust your plan for managing high blood pressure. RECOMMENDATIONS FOR TREATMENT AND FOLLOW-UP  The following recommendations are based on current guidelines for managing high blood pressure in nonpregnant adults. Use these recommendations to identify the proper follow-up period or treatment option based on your blood pressure reading. You can discuss these options with your caregiver.  Systolic pressure of 250 to 539 or diastolic pressure of 80 to 89: Follow up with your caregiver as directed.  Systolic pressure of 767 to 341 or diastolic pressure of 90 to 100: Follow up with your caregiver within 2 months.  Systolic pressure above 937 or diastolic pressure above 902: Follow up with your caregiver within 1 month.  Systolic pressure above 409 or diastolic pressure above 735: Consider antihypertensive therapy; follow up with your caregiver within 1 week.  Systolic pressure above 329 or diastolic pressure above 924: Begin antihypertensive therapy; follow up with your caregiver within 1 week. Document Released: 12/18/2011 Document Reviewed: 12/18/2011 [facility] Patient Information 2015 [place]. This information is not intended to replace advice  given to you by your health care provider. Make sure you discuss any questions you have with your health care provider.  Hyponatremia  Hyponatremia is when the salt (sodium) in your blood is low. When salt becomes low, your cells take in extra water and puff up (swell). The puffiness can happen in the whole body. It mostly affects the brain and is very serious.  HOME CARE  Only take medicine as told by your doctor.  Follow any diet instructions you were given. This includes limiting how much fluid you drink.  Keep all doctor visits for tests as told.  Avoid alcohol and drugs. GET HELP RIGHT AWAY IF:  You start to twitch and shake (seize).  You pass out (faint).  You continue to have watery poop (diarrhea) or you throw up (vomit).  You feel sick to your stomach (nauseous).  You are tired (  fatigued), have a headache, are confused, or feel weak.  Your problems that first brought you to the doctor come back.  You have trouble following your diet instructions. MAKE SURE YOU:   Understand these instructions.  Will watch your condition.  Will get help right away if you are not doing well or get worse. Document Released: 12/05/2010 Document Revised: 06/17/2011 Document Reviewed: 12/05/2010 [facility] Patient Information 2015 [place], [place]. This information is not intended to replace advice given to you by your health care provider. Make sure you discuss any questions you have with your health care provider.

## 2015-05-14 ENCOUNTER — Emergency Department (HOSPITAL_COMMUNITY): Payer: BLUE CROSS/BLUE SHIELD

## 2015-05-14 ENCOUNTER — Inpatient Hospital Stay (HOSPITAL_COMMUNITY)
Admission: EM | Admit: 2015-05-14 | Discharge: 2015-05-17 | DRG: 176 | Disposition: A | Discharge status: Hospice | Payer: BLUE CROSS/BLUE SHIELD | Attending: Internal Medicine | Admitting: Internal Medicine

## 2015-05-14 ENCOUNTER — Encounter (HOSPITAL_COMMUNITY): Payer: Self-pay | Admitting: Emergency Medicine

## 2015-05-14 DIAGNOSIS — N139 Obstructive and reflux uropathy, unspecified: Secondary | ICD-10-CM | POA: Diagnosis present

## 2015-05-14 DIAGNOSIS — Z936 Other artificial openings of urinary tract status: Secondary | ICD-10-CM

## 2015-05-14 DIAGNOSIS — Z515 Encounter for palliative care: Secondary | ICD-10-CM

## 2015-05-14 DIAGNOSIS — I340 Nonrheumatic mitral (valve) insufficiency: Secondary | ICD-10-CM | POA: Diagnosis not present

## 2015-05-14 DIAGNOSIS — Z66 Do not resuscitate: Secondary | ICD-10-CM | POA: Diagnosis present

## 2015-05-14 DIAGNOSIS — Z888 Allergy status to other drugs, medicaments and biological substances status: Secondary | ICD-10-CM | POA: Diagnosis not present

## 2015-05-14 DIAGNOSIS — Z79891 Long term (current) use of opiate analgesic: Secondary | ICD-10-CM

## 2015-05-14 DIAGNOSIS — C7971 Secondary malignant neoplasm of right adrenal gland: Secondary | ICD-10-CM | POA: Diagnosis present

## 2015-05-14 DIAGNOSIS — C7800 Secondary malignant neoplasm of unspecified lung: Secondary | ICD-10-CM | POA: Diagnosis present

## 2015-05-14 DIAGNOSIS — M25552 Pain in left hip: Secondary | ICD-10-CM

## 2015-05-14 DIAGNOSIS — Z91048 Other nonmedicinal substance allergy status: Secondary | ICD-10-CM

## 2015-05-14 DIAGNOSIS — I2699 Other pulmonary embolism without acute cor pulmonale: Secondary | ICD-10-CM | POA: Diagnosis not present

## 2015-05-14 DIAGNOSIS — I82401 Acute embolism and thrombosis of unspecified deep veins of right lower extremity: Secondary | ICD-10-CM | POA: Diagnosis present

## 2015-05-14 DIAGNOSIS — R9431 Abnormal electrocardiogram [ECG] [EKG]: Secondary | ICD-10-CM

## 2015-05-14 DIAGNOSIS — C787 Secondary malignant neoplasm of liver and intrahepatic bile duct: Secondary | ICD-10-CM | POA: Diagnosis present

## 2015-05-14 DIAGNOSIS — M79672 Pain in left foot: Secondary | ICD-10-CM

## 2015-05-14 DIAGNOSIS — R64 Cachexia: Secondary | ICD-10-CM | POA: Diagnosis present

## 2015-05-14 DIAGNOSIS — I10 Essential (primary) hypertension: Secondary | ICD-10-CM | POA: Diagnosis present

## 2015-05-14 DIAGNOSIS — C7951 Secondary malignant neoplasm of bone: Secondary | ICD-10-CM | POA: Diagnosis present

## 2015-05-14 DIAGNOSIS — W19XXXA Unspecified fall, initial encounter: Secondary | ICD-10-CM

## 2015-05-14 DIAGNOSIS — C259 Malignant neoplasm of pancreas, unspecified: Secondary | ICD-10-CM | POA: Diagnosis present

## 2015-05-14 DIAGNOSIS — R7303 Prediabetes: Secondary | ICD-10-CM | POA: Diagnosis present

## 2015-05-14 DIAGNOSIS — Z880 Allergy status to penicillin: Secondary | ICD-10-CM | POA: Diagnosis not present

## 2015-05-14 DIAGNOSIS — C7972 Secondary malignant neoplasm of left adrenal gland: Secondary | ICD-10-CM | POA: Diagnosis present

## 2015-05-14 DIAGNOSIS — Z681 Body mass index (BMI) 19 or less, adult: Secondary | ICD-10-CM | POA: Diagnosis not present

## 2015-05-14 DIAGNOSIS — M79609 Pain in unspecified limb: Secondary | ICD-10-CM | POA: Diagnosis not present

## 2015-05-14 DIAGNOSIS — R609 Edema, unspecified: Secondary | ICD-10-CM | POA: Diagnosis not present

## 2015-05-14 LAB — CBC WITH DIFFERENTIAL/PLATELET
BASOS PCT: 0 %
Basophils Absolute: 0 10*3/uL (ref 0.0–0.1)
EOS ABS: 0 10*3/uL (ref 0.0–0.7)
EOS PCT: 0 %
HCT: 34 % — ABNORMAL LOW (ref 36.0–46.0)
HEMOGLOBIN: 10.9 g/dL — AB (ref 12.0–15.0)
Lymphocytes Relative: 14 %
Lymphs Abs: 1 10*3/uL (ref 0.7–4.0)
MCH: 27.2 pg (ref 26.0–34.0)
MCHC: 32.1 g/dL (ref 30.0–36.0)
MCV: 84.8 fL (ref 78.0–100.0)
MONO ABS: 0.7 10*3/uL (ref 0.1–1.0)
MONOS PCT: 10 %
NEUTROS PCT: 76 %
Neutro Abs: 5.3 10*3/uL (ref 1.7–7.7)
PLATELETS: 231 10*3/uL (ref 150–400)
RBC: 4.01 MIL/uL (ref 3.87–5.11)
RDW: 16.6 % — AB (ref 11.5–15.5)
WBC: 6.9 10*3/uL (ref 4.0–10.5)

## 2015-05-14 LAB — PROTIME-INR
INR: 1.24 (ref 0.00–1.49)
Prothrombin Time: 15.8 seconds — ABNORMAL HIGH (ref 11.6–15.2)

## 2015-05-14 LAB — COMPREHENSIVE METABOLIC PANEL
ALBUMIN: 2.9 g/dL — AB (ref 3.5–5.0)
ALT: 14 U/L (ref 14–54)
ANION GAP: 10 (ref 5–15)
AST: 28 U/L (ref 15–41)
Alkaline Phosphatase: 73 U/L (ref 38–126)
BUN: 20 mg/dL (ref 6–20)
CHLORIDE: 101 mmol/L (ref 101–111)
CO2: 23 mmol/L (ref 22–32)
Calcium: 8.7 mg/dL — ABNORMAL LOW (ref 8.9–10.3)
Creatinine, Ser: 0.91 mg/dL (ref 0.44–1.00)
GFR calc non Af Amer: 57 mL/min — ABNORMAL LOW (ref >60)
GLUCOSE: 99 mg/dL (ref 65–99)
POTASSIUM: 3.8 mmol/L (ref 3.5–5.1)
SODIUM: 134 mmol/L — AB (ref 135–145)
Total Bilirubin: 0.6 mg/dL (ref 0.3–1.2)
Total Protein: 6.9 g/dL (ref 6.5–8.1)

## 2015-05-14 LAB — TROPONIN I: Troponin I: 0.05 ng/mL — ABNORMAL HIGH (ref <0.031)

## 2015-05-14 LAB — I-STAT TROPONIN, ED: Troponin i, poc: 0.03 ng/mL (ref 0.00–0.08)

## 2015-05-14 LAB — MAGNESIUM: MAGNESIUM: 1.5 mg/dL — AB (ref 1.7–2.4)

## 2015-05-14 LAB — APTT: aPTT: 37 seconds (ref 24–37)

## 2015-05-14 LAB — I-STAT CHEM 8, ED
BUN: 20 mg/dL (ref 6–20)
CALCIUM ION: 1.14 mmol/L (ref 1.13–1.30)
CHLORIDE: 102 mmol/L (ref 101–111)
CREATININE: 0.9 mg/dL (ref 0.44–1.00)
GLUCOSE: 96 mg/dL (ref 65–99)
HCT: 37 % (ref 36.0–46.0)
Hemoglobin: 12.6 g/dL (ref 12.0–15.0)
POTASSIUM: 3.7 mmol/L (ref 3.5–5.1)
Sodium: 135 mmol/L (ref 135–145)
TCO2: 22 mmol/L (ref 0–100)

## 2015-05-14 LAB — BRAIN NATRIURETIC PEPTIDE: B Natriuretic Peptide: 116.1 pg/mL — ABNORMAL HIGH (ref 0.0–100.0)

## 2015-05-14 LAB — HEPARIN LEVEL (UNFRACTIONATED): Heparin Unfractionated: 0.19 IU/mL — ABNORMAL LOW (ref 0.30–0.70)

## 2015-05-14 MED ORDER — SIMETHICONE 80 MG PO CHEW: 80.0000 mg | CHEWABLE_TABLET | Freq: Four times a day (QID) | ORAL | Status: DC | PRN

## 2015-05-14 MED ORDER — PROBIOTIC & ACIDOPHILUS EX ST PO CAPS: 1.0000 | ORAL_CAPSULE | Freq: Every day | ORAL | Status: DC

## 2015-05-14 MED ORDER — ACETAMINOPHEN 650 MG RE SUPP: 650.0000 mg | Freq: Four times a day (QID) | RECTAL | Status: DC | PRN

## 2015-05-14 MED ORDER — LEVALBUTEROL HCL 0.63 MG/3ML IN NEBU: 0.6300 mg | INHALATION_SOLUTION | Freq: Four times a day (QID) | RESPIRATORY_TRACT | Status: DC | PRN

## 2015-05-14 MED ORDER — SODIUM CHLORIDE 0.9% FLUSH
10.0000 mL | INTRAVENOUS | Status: DC | PRN
  Administered 2015-05-14 – 2015-05-17 (×4): 10 mL
  Filled 2015-05-14 (×4): qty 40

## 2015-05-14 MED ORDER — POLYETHYLENE GLYCOL 3350 17 G PO PACK
17.0000 g | PACK | Freq: Every day | ORAL | Status: DC
  Administered 2015-05-15 – 2015-05-17 (×3): 17 g via ORAL
  Filled 2015-05-14 (×3): qty 1

## 2015-05-14 MED ORDER — METHADONE HCL 10 MG/ML PO CONC
3.0000 mg | Freq: Three times a day (TID) | ORAL | Status: DC
  Administered 2015-05-14 – 2015-05-17 (×9): 3 mg via ORAL
  Filled 2015-05-14 (×9): qty 0

## 2015-05-14 MED ORDER — ATENOLOL 25 MG PO TABS
25.0000 mg | ORAL_TABLET | Freq: Two times a day (BID) | ORAL | Status: DC
  Administered 2015-05-14 – 2015-05-15 (×2): 25 mg via ORAL
  Filled 2015-05-14 (×2): qty 1

## 2015-05-14 MED ORDER — ONDANSETRON HCL 4 MG/2ML IJ SOLN
4.0000 mg | Freq: Four times a day (QID) | INTRAMUSCULAR | Status: DC | PRN
  Administered 2015-05-14 – 2015-05-17 (×3): 4 mg via INTRAVENOUS
  Filled 2015-05-14 (×3): qty 2

## 2015-05-14 MED ORDER — SODIUM CHLORIDE 0.9% FLUSH: 3.0000 mL | Freq: Two times a day (BID) | INTRAVENOUS | Status: DC

## 2015-05-14 MED ORDER — HEPARIN BOLUS VIA INFUSION
1200.0000 [IU] | Freq: Once | INTRAVENOUS | Status: AC
  Administered 2015-05-14: 1200 [IU] via INTRAVENOUS
  Filled 2015-05-14: qty 1200

## 2015-05-14 MED ORDER — PANTOPRAZOLE SODIUM 20 MG PO TBEC
20.0000 mg | DELAYED_RELEASE_TABLET | Freq: Every day | ORAL | Status: DC
  Administered 2015-05-15: 20 mg via ORAL
  Filled 2015-05-14 (×2): qty 1

## 2015-05-14 MED ORDER — HYDROMORPHONE HCL 1 MG/ML IJ SOLN
1.0000 mg | INTRAMUSCULAR | Status: DC | PRN
  Administered 2015-05-14 (×2): 1 mg via INTRAVENOUS
  Filled 2015-05-14 (×2): qty 1

## 2015-05-14 MED ORDER — PANCRELIPASE (LIP-PROT-AMYL) 36000-114000 UNITS PO CPEP
2.0000 | ORAL_CAPSULE | Freq: Three times a day (TID) | ORAL | Status: DC
  Administered 2015-05-15 – 2015-05-17 (×5): 72000 [IU] via ORAL
  Filled 2015-05-14 (×9): qty 2

## 2015-05-14 MED ORDER — ONDANSETRON HCL 4 MG PO TABS: 4.0000 mg | ORAL_TABLET | Freq: Four times a day (QID) | ORAL | Status: DC | PRN

## 2015-05-14 MED ORDER — HYDROMORPHONE HCL 1 MG/ML IJ SOLN
0.5000 mg | Freq: Once | INTRAMUSCULAR | Status: AC
  Administered 2015-05-14: 0.5 mg via INTRAVENOUS
  Filled 2015-05-14: qty 1

## 2015-05-14 MED ORDER — DOCUSATE SODIUM 100 MG PO CAPS
100.0000 mg | ORAL_CAPSULE | Freq: Two times a day (BID) | ORAL | Status: DC
  Administered 2015-05-14 – 2015-05-17 (×6): 100 mg via ORAL
  Filled 2015-05-14 (×6): qty 1

## 2015-05-14 MED ORDER — SODIUM CHLORIDE 0.9% FLUSH: 10.0000 mL | Freq: Two times a day (BID) | INTRAVENOUS | Status: DC

## 2015-05-14 MED ORDER — IOHEXOL 350 MG/ML SOLN
100.0000 mL | Freq: Once | INTRAVENOUS | Status: AC | PRN
  Administered 2015-05-14: 100 mL via INTRAVENOUS

## 2015-05-14 MED ORDER — ACETAMINOPHEN 325 MG PO TABS
650.0000 mg | ORAL_TABLET | Freq: Four times a day (QID) | ORAL | Status: DC | PRN
  Administered 2015-05-14 – 2015-05-17 (×9): 650 mg via ORAL
  Filled 2015-05-14 (×9): qty 2

## 2015-05-14 MED ORDER — SODIUM CHLORIDE 0.9 % IV SOLN
INTRAVENOUS | Status: DC
  Administered 2015-05-14: 17:00:00 via INTRAVENOUS

## 2015-05-14 MED ORDER — OXYCODONE HCL 5 MG PO TABS
2.5000 mg | ORAL_TABLET | ORAL | Status: DC | PRN
Start: 2015-05-14 — End: 2015-05-15
  Administered 2015-05-14: 5 mg via ORAL
  Filled 2015-05-14: qty 1

## 2015-05-14 MED ORDER — HEPARIN (PORCINE) IN NACL 100-0.45 UNIT/ML-% IJ SOLN
600.0000 [IU]/h | INTRAMUSCULAR | Status: DC
  Administered 2015-05-14: 600 [IU]/h via INTRAVENOUS
  Filled 2015-05-14: qty 250

## 2015-05-14 MED ORDER — SENNOSIDES-DOCUSATE SODIUM 8.6-50 MG PO TABS
3.0000 | ORAL_TABLET | Freq: Two times a day (BID) | ORAL | Status: DC
  Administered 2015-05-14: 3 via ORAL
  Administered 2015-05-15: 4 via ORAL
  Administered 2015-05-15: 3 via ORAL
  Administered 2015-05-16 (×2): 4 via ORAL
  Administered 2015-05-17: 3 via ORAL
  Filled 2015-05-14: qty 3
  Filled 2015-05-14 (×3): qty 4
  Filled 2015-05-14 (×2): qty 3

## 2015-05-14 MED ORDER — LORAZEPAM 0.5 MG PO TABS
0.5000 mg | ORAL_TABLET | Freq: Two times a day (BID) | ORAL | Status: DC
  Administered 2015-05-14 – 2015-05-17 (×3): 0.5 mg via ORAL
  Filled 2015-05-14 (×6): qty 1

## 2015-05-14 NOTE — H&P (Signed)
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 The patient fell on Friday while trying to take a bath, landed on her right side, subsequently started noticing swelling of the right leg and this morning patient was noted to be short of breath by her daughter. Swelling of the right lower extremity did not improve by increasing the dose of Lasix. Patient recently got placed on home hospice. CT chest today showed small pulmonary embolism in the right lower lobe, without right heart strain. Numerous lung nodules which have progressed. Bilateral adrenal metastasis. Patient's last office oncology visit was on 04/20/15 from [facility]. Patient has been receiving intermittent blood transfusions at [facility] for hemoglobin less than 9.0. Most recent transfusion was on 1/23... Patient is mostly in the bed or in the recliner and sleeping most of the time. She is on methadone and oxycodone for pain control. Patient also has history of chronic back pain and has received prior right L2-L3 steroid epidural steroid injections. According to the notes the patient has had a poor performance status ECOG of 3,, advanced disease, biliary obstruction, and does not have any treatment options available at this time. Good symptom management at home could increase her survival as opposed to chemotherapy. Patient was changed to being a DO NOT RESUSCITATE on 04/20/15. Patient is followed by Dr. Edwena Felty in clinic for pain management. She does  not describe any cord compression symptoms.        Review of Systems: negative for the following  Constitutional: Denies fever, chills, diaphoresis, appetite change and fatigue.  HEENT: Denies photophobia, eye pain, redness, hearing loss, ear pain, congestion, sore throat, rhinorrhea, sneezing, mouth sores, trouble swallowing, neck pain, neck stiffness and tinnitus.  Respiratory: Positive for SOB, DOE, cough, chest tightness, and wheezing.  Cardiovascular: Denies chest pain, palpitations and leg swelling.  Gastrointestinal: Denies nausea, vomiting, abdominal pain, diarrhea, constipation, blood in stool and abdominal distention.  Genitourinary: Denies dysuria, urgency, frequency, hematuria, flank pain and difficulty urinating.  Musculoskeletal: Positive for right leg pain and right flank pain, myalgias, back pain, joint swelling, arthralgias and gait problem.  Skin: Denies pallor, rash and wound.  Neurological: Denies dizziness, seizures, syncope, weakness, light-headedness, numbness and headaches.  Hematological: Denies adenopathy. Easy bruising, personal or family bleeding history  Psychiatric/Behavioral: Denies suicidal ideation, mood changes, confusion, nervousness, sleep disturbance and agitation       Past Medical History  Diagnosis Date  . Hypertension   . High cholesterol   . Osteoporosis   . Prediabetes   . Gall stones   . Cancer ([place])   . Pancreatic cancer [facility])      Past Surgical History  Procedure Laterality Date  . Ercp N/A 03/14/2013    Procedure: ENDOSCOPIC RETROGRADE CHOLANGIOPANCREATOGRAPHY (ERCP),stone removal, possible sphincterotomy;  Surgeon: Missy Sabins, MD;  Location: [place];  Service: Endoscopy;  Laterality: N/A;  . Cholecystectomy N/A 03/16/2013    Procedure: LAPAROSCOPIC CHOLECYSTECTOMY WITH INTRAOPERATIVE CHOLANGIOGRAM;  Surgeon: Earnstine Regal, MD;  Location: [place];  Service: General;  Laterality: N/A;  .  Esophagogastroduodenoscopy N/A  07/30/2013    Procedure: ESOPHAGOGASTRODUODENOSCOPY (EGD);  Surgeon: Beryle Beams, MD;  Location[facility] ENDOSCOPY;  Service: Endoscopy;  Laterality: N/A;  . Ercp N/A 07/30/2013    Procedure: ENDOSCOPIC RETROGRADE CHOLANGIOPANCREATOGRAPHY (ERCP);  Surgeon: Beryle Beams, MD;  Location: [facility] ENDOSCOPY;  Service: Endoscopy;  Laterality: N/A;      Social History:  reports that she has never smoked. She does not have any smokeless tobacco history on file. She reports that she does not drink alcohol or use illicit drugs.          FAMILY HISTORY  When questioned  Directly-patient reports  No family history of HTN, CVA ,DIABETES, TB, Cancer CAD, Bleeding Disorders, Sickle Cell, diabetes, anemia, asthma,   Prior to Admission medications   Medication Sig Start Date End Date Taking? Authorizing Provider  acetaminophen (TYLENOL) 500 MG tablet Take 500 mg by mouth every 6 (six) hours as needed for moderate pain.   Yes Historical Provider, MD  calcium-vitamin D (OSCAL WITH D) 500-200 MG-UNIT per tablet Take 1 tablet by mouth 2 (two) times daily.   Yes Historical Provider, MD  furosemide (LASIX) 20 MG tablet Take 40 mg by mouth daily. 04/06/15  Yes Historical Provider, MD  lidocaine (LIDODERM) 5 % PUT PATCH ON AFFECTED AREA FOR 12 HRS AND REMOVE FOR 12 HR 04/07/15  Yes Historical Provider, MD  LORazepam (ATIVAN) 0.5 MG tablet Take 0.5 mg by mouth 2 (two) times daily. 04/25/15 05/25/15 Yes Historical Provider, MD  methadone (DOLOPHINE) 1 mg/ml oral solution Take 3 mg/kg by mouth every 8 (eight) hours.   Yes Historical Provider, MD  methadone (DOLOPHINE) 10 MG/5ML solution Take 3 mg by mouth every 8 (eight) hours.   Yes Historical Provider, MD  ondansetron (ZOFRAN) 4 MG tablet Take 1 tablet (4 mg total) by mouth every 6 (six) hours as needed for nausea. Patient taking differently: Take 4 mg by mouth 2 (two) times daily.  08/02/13  Yes [name], DO  oxyCODONE (OXY IR/ROXICODONE) 5 MG immediate release  tablet Take 2.5-5 mg by mouth every 4 (four) hours as needed for severe pain (takes half tablet during the day takes whole tablet at bedtime).   Yes Historical Provider, MD  Pancrelipase, Lip-Prot-Amyl, (ZENPEP PO) Take 2 capsules by mouth 3 (three) times daily.   Yes Historical Provider, MD  pantoprazole (PROTONIX) 20 MG tablet Take 20 mg by mouth daily. 09/01/13  Yes Historical Provider, MD  polyethylene glycol (MIRALAX / GLYCOLAX) packet Take 17 g by mouth daily.   Yes Historical Provider, MD  Probiotic Product (PROBIOTIC & ACIDOPHILUS EX ST) CAPS Take 1 capsule by mouth daily.   Yes Historical Provider, MD  senna-docusate (SENNA PLUS) 8.6-50 MG tablet Take 3-4 tablets by mouth 2 (two) times daily. Takes three tablet in the morning and four tablets at dinner.   Yes Historical Provider, MD  TURMERIC PO Take 1 tablet by mouth daily.   Yes Historical Provider, MD  atenolol (TENORMIN) 25 MG tablet Take 1 tablet (25 mg total) by mouth 2 (two) times daily. Patient not taking: Reported on 05/14/2015 10/23/14   Linton Flemings, MD  docusate sodium 100 MG CAPS Take 100 mg by mouth 2 (two) times daily. Patient not taking: Reported on 05/14/2015 08/02/13   [name], DO  simethicone (MYLICON) 80 MG chewable tablet Chew 1 tablet (80 mg total) by mouth every 6 (six) hours as needed for flatulence. Patient not taking: Reported on 05/14/2015 08/02/13   Janett Billow  Alison Stalling, DO     Physical Exam: Filed Vitals:   05/14/15 1330 05/14/15 1408 05/14/15 1504 05/14/15 1506  BP:  153/81 153/81   Pulse: 117  109   Temp:      TempSrc:      Resp: 19 20 18    Weight:    47.129 kg (103 lb 14.4 oz)  SpO2: 96%  97%      Constitutional: Vital signs reviewed. Patient is a well-developed and well-nourished in no acute distress and cooperative with exam. Alert and oriented x3.  Head: Normocephalic and atraumatic  Ear: TM normal bilaterally  Mouth: no erythema or exudates, MMM  Eyes: PERRL, EOMI, conjunctivae normal, No scleral  icterus.  Neck: Supple, Trachea midline normal ROM, No JVD, mass, thyromegaly, or carotid bruit present.  Cardiovascular: RRR, S1 normal, S2 normal, no MRG, pulses symmetric and intact bilaterally  Pulmonary/Chest: CTAB, no wheezes, rales, or rhonchi  Abdominal: Soft. Non-tender, non-distended, bowel sounds are normal, no masses, organomegaly, or guarding present.  GU: no CVA tenderness Musculoskeletal: Swelling of the right lower extremity, stiffness, ROM full and no nontender Ext: no edema and no cyanosis, pulses palpable bilaterally (DP and PT)  Hematology: no cervical, inginal, or axillary adenopathy.  Neurological: A&O x3, Strenght is normal and symmetric bilaterally, cranial nerve II-XII are grossly intact, no focal motor deficit, sensory intact to light touch bilaterally.  Skin: Warm, dry and intact. No rash, cyanosis, or clubbing.  Psychiatric: Normal mood and affect. speech and behavior is normal. Judgment and thought content normal. Cognition and memory are normal.      Data Review   Micro Results No results found for this or any previous visit (from the past 240 hour(s)).  Radiology Reports Ct Angio Chest Pe W/cm &/or Wo Cm  05/14/2015  CLINICAL DATA:  Pancreatic cancer. Right leg swelling. Rule out pulmonary embolism. EXAM: CT ANGIOGRAPHY CHEST WITH CONTRAST TECHNIQUE: Multidetector CT imaging of the chest was performed using the standard protocol during bolus administration of intravenous contrast. Multiplanar CT image reconstructions and MIPs were obtained to evaluate the vascular anatomy. CONTRAST:  181mL OMNIPAQUE IOHEXOL 350 MG/ML SOLN COMPARISON:  CT chest 08/02/2013 FINDINGS: Small filling defect right lower lobe pulmonary artery compatible with small volume pulmonary embolus. No other pulmonary emboli identified. No evidence of right heart strain. Right ventricle nondilated. Pulmonary arteries normal in caliber. Negative for aortic dissection or aneurysm. Heart size upper  normal. Mild pericardial thickening. No significant pericardial effusion. Numerous lung nodules bilaterally have progressed since the prior CT. These are irregular and ill-defined. The largest lesion in the right lower lobe posteriorly measures 64 x 43 mm. Several lesions are angular. Other lesions are more nodular. Given the progression over time and history pancreatic cancer, these are likely related to metastatic disease. Right lower lobe lung cancer could also have this appearance. No pathologically enlarged hilar or mediastinal lymph nodes. Negative for pleural effusion Right adrenal mass measures 20 x 38 mm with significant growth from 10/20/2014 compatible with metastatic disease. Mild left adrenal enlargement also suggesting metastatic disease. Small amount of ascites. Review of the MIP images confirms the above findings. IMPRESSION: Small amount of pulmonary embolism right lower lobe pulmonary artery without right heart strain Numerous lung nodules have progressed from the prior study consistent with metastatic disease. Large mass right lower lobe could be related to primary bronchogenic carcinoma. Bilateral adrenal metastasis. Small amount of ascites in the upper abdomen. Electronically Signed   By: Franchot Gallo M.D.   On:  05/14/2015 14:13   Dg Chest Portable 1 View  05/14/2015  CLINICAL DATA:  Pain and swelling of the right leg.  Dyspnea. EXAM: PORTABLE CHEST 1 VIEW COMPARISON:  CT chest 08/02/2013 FINDINGS: There is focal opacity in the right lower lobe. There is mild bilateral interstitial thickening likely chronic. There is no pleural effusion or pneumothorax. The heart and mediastinal contours are unremarkable. There is a right sided Port-A-Cath with the tip projecting over the SVC. The osseous structures are unremarkable. IMPRESSION: 1. Focal opacity in the right lower lobe concerning for pneumonia versus malignancy. Followup PA and lateral chest X-ray is recommended in 3-4 weeks following trial  of antibiotic therapy to ensure resolution and exclude underlying malignancy. Electronically Signed   By: Kathreen Devoid   On: 05/14/2015 13:22     CBC  Recent Labs Lab 05/14/15 1307 05/14/15 1313  WBC 6.9  --   HGB 10.9* 12.6  HCT 34.0* 37.0  PLT 231  --   MCV 84.8  --   MCH 27.2  --   MCHC 32.1  --   RDW 16.6*  --   LYMPHSABS 1.0  --   MONOABS 0.7  --   EOSABS 0.0  --   BASOSABS 0.0  --     Chemistries   Recent Labs Lab 05/14/15 1307 05/14/15 1313  NA 134* 135  K 3.8 3.7  CL 101 102  CO2 23  --   GLUCOSE 99 96  BUN 20 20  CREATININE 0.91 0.90  CALCIUM 8.7*  --   AST 28  --   ALT 14  --   ALKPHOS 73  --   BILITOT 0.6  --    ------------------------------------------------------------------------------------------------------------------ CrCl cannot be calculated (Unknown ideal weight.). ------------------------------------------------------------------------------------------------------------------ No results for input(s): HGBA1C in the last 72 hours. ------------------------------------------------------------------------------------------------------------------ No results for input(s): CHOL, HDL, LDLCALC, TRIG, CHOLHDL, LDLDIRECT in the last 72 hours. ------------------------------------------------------------------------------------------------------------------ No results for input(s): TSH, T4TOTAL, T3FREE, THYROIDAB in the last 72 hours.  Invalid input(s): FREET3 ------------------------------------------------------------------------------------------------------------------ No results for input(s): VITAMINB12, FOLATE, FERRITIN, TIBC, IRON, RETICCTPCT in the last 72 hours.  Coagulation profile  Recent Labs Lab 05/14/15 1307  INR 1.24    No results for input(s): DDIMER in the last 72 hours.  Cardiac Enzymes No results for input(s): CKMB, TROPONINI, MYOGLOBIN in the last 168 hours.  Invalid input(s):  CK ------------------------------------------------------------------------------------------------------------------ Invalid input(s): POCBNP   CBG: No results for input(s): GLUCAP in the last 168 hours.     EKG: Independently reviewed.    Assessment/Plan Principal Problem:   Acute pulmonary embolism (HCC) Likely the setting of metastatic end-stage stage IV pancreatic cancer Patient has been initiated on a heparin drip Discussed treatment options with the family Lovenox vs NOAC Would recommend to speak to patient's oncologist at [facility] and make a decision tomorrow We will have palliative care follow the patient as well   Right leg swelling-suspect the patient has DVT, venous Doppler pending  Chest pain likely in the setting of #1, vs metastatic lung cancer, doubt pericardial effusion by 2-D echo is pending at this time, continue to cycle cardiac enzymes given advanced metastatic cancer, patient is not a candidate for invasive cardiac workup according to the cardiologist and spoke with EDP.  Continue to cycle cardiac enzymes      Code Status Orders        Start     Ordered   05/14/15 1529  Do not attempt resuscitation (DNR)   Continuous    Question Answer Comment  In the event of cardiac or respiratory ARREST Do not call a "code blue"   In the event of cardiac or respiratory ARREST Do not perform Intubation, CPR, defibrillation or ACLS   In the event of cardiac or respiratory ARREST Use medication by any route, position, wound care, and other measures to relive pain and suffering. May use oxygen, suction and manual treatment of airway obstruction as needed for comfort.      05/14/15 1529    Code Status History    Date Active Date Inactive Code Status Order ID Comments User Context   07/30/2013 12:43 AM 08/02/2013 11:15 PM Full Code [id number]  Robbie Lis, MD Inpatient   03/16/2013  1:22 PM 03/18/2013  7:25 PM Full Code [id number]  Armandina Gemma, MD Inpatient   03/13/2013   6:41 AM 03/16/2013  1:22 PM Full Code [id number]  Berle Mull, MD Inpatient    Advance Directive Documentation        Most Recent Value   Type of Advance Directive  Out of facility DNR (pink MOST or yellow form)   Pre-existing out of facility DNR order (yellow form or pink MOST form)  Yellow form placed in chart (order not valid for inpatient use)   "MOST" Form in Place?        Family Communication: bedside Disposition Plan: admit   Total time spent 55 minutes.Greater than 50% of this time was spent in counseling, explanation of diagnosis, planning of further management, and coordination of care  [facility] Pager [phone]  If 7PM-7AM, please contact night-coverage [email or web address] Password [email or web address] 05/14/2015, 3:35 PM

## 2015-05-14 NOTE — ED Notes (Signed)
Per patient's daughter, pt is due for methadone 3mg  at 1500 and pts own pain meds. Dr. Oleta Mouse notified and pt is able to take own pain meds that she brought from home.

## 2015-05-14 NOTE — ED Notes (Signed)
Pt returned from ct. Pt resting comfortably, family remains at bedside.

## 2015-05-14 NOTE — Progress Notes (Signed)
ANTICOAGULATION CONSULT NOTE - Follow Up Consult  Pharmacy Consult for Heparin Indication: pulmonary embolus  Allergies  Allergen Reactions  . Oxaliplatin Itching  . Penicillins     Does not remember Has patient had a PCN reaction causing immediate rash, facial/tongue/throat swelling, SOB or lightheadedness with hypotension:  Has patient had a PCN reaction causing severe rash involving mucus membranes or skin necrosis:  Has patient had a PCN reaction that required hospitalization  Has patient had a PCN reaction occurring within the last 10 years:  If all of the above answers are "NO", then may proceed with Cep  . Tegaderm Ag Mesh [Silver]     Skin redness.   . Transderm-Scop [Scopolamine]     Had the opposite effect.    Patient Measurements:    Total Body Weight:  Heparin Dosing Weight:   Vital Signs: Temp: 98.3 F (36.8 C) (02/05 1219) Temp Source: Oral (02/05 1219) BP: 153/81 mmHg (02/05 1408) Pulse Rate: 117 (02/05 1330)  Labs:  Recent Labs  05/14/15 1307 05/14/15 1313  HGB 10.9* 12.6  HCT 34.0* 37.0  PLT 231  --   APTT 37  --   LABPROT 15.8*  --   INR 1.24  --   CREATININE 0.91 0.90   CrCl cannot be calculated (Unknown ideal weight.).  Medications:  Scheduled:  Infusions:   Assessment: 56 yoF to ED via EMS with LE swelling after fall on Thursday.  Hx of Pancreatic Ca with liver & lung mets s/p FOLFOX, Gemzar. Plan LE ultrasound, r/o VTE, Heparin infusion for RLL PE.  Goal of Therapy:  Heparin level 0.3-0.7 units/ml Monitor platelets by anticoagulation protocol: Yes   Plan:   Heparin bolus of 1200 units followed by infusion at 600 units/hr  First Heparin level in 6 hr  Daily Heparin level when results stable, daily CBC while on Heparin  Minda Ditto PharmD Pager [phone] 05/14/2015, 3:23 PM

## 2015-05-14 NOTE — ED Notes (Signed)
Bed: WA03 Expected date: 05/14/15 Expected time: 12:03 PM Means of arrival: Ambulance Comments: Ca pt, leg swelling

## 2015-05-14 NOTE — ED Provider Notes (Signed)
CSN: [id number]     Arrival date & time 05/14/15  1202 History   First MD Initiated Contact with Patient 05/14/15 1237     Chief Complaint  Patient presents with  . Leg Injury    R leg     (Consider location/radiation/quality/duration/timing/severity/associated sxs/prior Treatment) HPI 82 year old female who presents with right lower extremity pain and swelling. History of hypertension, hyperlipidemia, pancreatic cancer status post chemoradiation therapy. According to family was just recently started on hospice care. History is provided by the patient's daughter who states that she began to notice right lower extremity swelling 2 days ago. This occurred after she had fallen onto her buttocks from a stool. This morning complained of some shortness of breath, which resolved.Only brought to ED for evaluation of swelling in her right lower extremity. No chest pain, current shortness of breath, cough, fevers or chills.   Past Medical History  Diagnosis Date  . Hypertension   . High cholesterol   . Osteoporosis   . Prediabetes   . Gall stones   . Cancer ([place])   . Pancreatic cancer [facility])    Past Surgical History  Procedure Laterality Date  . Ercp N/A 03/14/2013    Procedure: ENDOSCOPIC RETROGRADE CHOLANGIOPANCREATOGRAPHY (ERCP),stone removal, possible sphincterotomy;  Surgeon: Missy Sabins, MD;  Location: [place];  Service: Endoscopy;  Laterality: N/A;  . Cholecystectomy N/A 03/16/2013    Procedure: LAPAROSCOPIC CHOLECYSTECTOMY WITH INTRAOPERATIVE CHOLANGIOGRAM;  Surgeon: Earnstine Regal, MD;  Location: [place];  Service: General;  Laterality: N/A;  . Esophagogastroduodenoscopy N/A 07/30/2013    Procedure: ESOPHAGOGASTRODUODENOSCOPY (EGD);  Surgeon: Beryle Beams, MD;  Location: [facility] ENDOSCOPY;  Service: Endoscopy;  Laterality: N/A;  . Ercp N/A 07/30/2013    Procedure: ENDOSCOPIC RETROGRADE CHOLANGIOPANCREATOGRAPHY (ERCP);  Surgeon: Beryle Beams, MD;  Location: [facility] ENDOSCOPY;  Service:  Endoscopy;  Laterality: N/A;   History reviewed. No pertinent family history. Social History  Substance Use Topics  . Smoking status: Never Smoker   . Smokeless tobacco: None  . Alcohol Use: No   OB History    No data available     Review of Systems 10/14 systems reviewed and are negative other than those stated in the HPI    Allergies  Oxaliplatin; Penicillins; Tegaderm ag mesh; and Transderm-scop  Home Medications   Prior to Admission medications   Medication Sig Start Date End Date Taking? Authorizing Provider  acetaminophen (TYLENOL) 500 MG tablet Take 500 mg by mouth every 6 (six) hours as needed for moderate pain.   Yes Historical Provider, MD  calcium-vitamin D (OSCAL WITH D) 500-200 MG-UNIT per tablet Take 1 tablet by mouth 2 (two) times daily.   Yes Historical Provider, MD  furosemide (LASIX) 20 MG tablet Take 40 mg by mouth daily. 04/06/15  Yes Historical Provider, MD  lidocaine (LIDODERM) 5 % PUT PATCH ON AFFECTED AREA FOR 12 HRS AND REMOVE FOR 12 HR 04/07/15  Yes Historical Provider, MD  LORazepam (ATIVAN) 0.5 MG tablet Take 0.5 mg by mouth 2 (two) times daily. 04/25/15 05/25/15 Yes Historical Provider, MD  methadone (DOLOPHINE) 1 mg/ml oral solution Take 3 mg/kg by mouth every 8 (eight) hours.   Yes Historical Provider, MD  methadone (DOLOPHINE) 10 MG/5ML solution Take 3 mg by mouth every 8 (eight) hours.   Yes Historical Provider, MD  ondansetron (ZOFRAN) 4 MG tablet Take 1 tablet (4 mg total) by mouth every 6 (six) hours as needed for nausea. Patient taking differently: Take 4 mg by mouth 2 (two)  times daily.  08/02/13  Yes [name], DO  oxyCODONE (OXY IR/ROXICODONE) 5 MG immediate release tablet Take 2.5-5 mg by mouth every 4 (four) hours as needed for severe pain (takes half tablet during the day takes whole tablet at bedtime).   Yes Historical Provider, MD  Pancrelipase, Lip-Prot-Amyl, (ZENPEP PO) Take 2 capsules by mouth 3 (three) times daily.   Yes Historical  Provider, MD  pantoprazole (PROTONIX) 20 MG tablet Take 20 mg by mouth daily. 09/01/13  Yes Historical Provider, MD  polyethylene glycol (MIRALAX / GLYCOLAX) packet Take 17 g by mouth daily.   Yes Historical Provider, MD  Probiotic Product (PROBIOTIC & ACIDOPHILUS EX ST) CAPS Take 1 capsule by mouth daily.   Yes Historical Provider, MD  senna-docusate (SENNA PLUS) 8.6-50 MG tablet Take 3-4 tablets by mouth 2 (two) times daily. Takes three tablet in the morning and four tablets at dinner.   Yes Historical Provider, MD  TURMERIC PO Take 1 tablet by mouth daily.   Yes Historical Provider, MD  atenolol (TENORMIN) 25 MG tablet Take 1 tablet (25 mg total) by mouth 2 (two) times daily. Patient not taking: Reported on 05/14/2015 10/23/14   Linton Flemings, MD  docusate sodium 100 MG CAPS Take 100 mg by mouth 2 (two) times daily. Patient not taking: Reported on 05/14/2015 08/02/13   [name], DO  simethicone (MYLICON) 80 MG chewable tablet Chew 1 tablet (80 mg total) by mouth every 6 (six) hours as needed for flatulence. Patient not taking: Reported on 05/14/2015 08/02/13   Tomi Bamberger Vann, DO   BP 153/81 mmHg  Pulse 109  Temp(Src) 98.3 F (36.8 C) (Oral)  Resp 18  Wt 103 lb 14.4 oz (47.129 kg)  SpO2 97% Physical Exam Physical Exam  Nursing note and vitals reviewed. Constitutional: Chronically ill appearing and in no acute distress Head: Normocephalic and atraumatic.  Mouth/Throat: Oropharynx is clear and moist.  Neck:  Neck supple.  Cardiovascular: Tachycardic rate and regular rhythm.   Pulmonary/Chest: Effort normal and breath sounds normal.  Abdominal: Soft. There is no tenderness. There is no rebound and no guarding.  Musculoskeletal: RLE edema compared to LLE. Normal range of motion.  Neurological: Alert, fluent speech, moves all extremities symmetrically Skin: Skin is warm and dry.  Psychiatric: Cooperative   ED Course  Procedures (including critical care time) Labs Review Labs Reviewed   CBC WITH DIFFERENTIAL/PLATELET - Abnormal; Notable for the following:    Hemoglobin 10.9 (*)    HCT 34.0 (*)    RDW 16.6 (*)    All other components within normal limits  COMPREHENSIVE METABOLIC PANEL - Abnormal; Notable for the following:    Sodium 134 (*)    Calcium 8.7 (*)    Albumin 2.9 (*)    GFR calc non Af Amer 57 (*)    All other components within normal limits  BRAIN NATRIURETIC PEPTIDE - Abnormal; Notable for the following:    B Natriuretic Peptide 116.1 (*)    All other components within normal limits  PROTIME-INR - Abnormal; Notable for the following:    Prothrombin Time 15.8 (*)    All other components within normal limits  APTT  HEPARIN LEVEL (UNFRACTIONATED)  MAGNESIUM  TROPONIN I  TROPONIN I  TROPONIN I  I-STAT TROPOININ, ED  I-STAT CHEM 8, ED    Imaging Review Ct Angio Chest Pe W/cm &/or Wo Cm  05/14/2015  CLINICAL DATA:  Pancreatic cancer. Right leg swelling. Rule out pulmonary embolism. EXAM: CT  ANGIOGRAPHY CHEST WITH CONTRAST TECHNIQUE: Multidetector CT imaging of the chest was performed using the standard protocol during bolus administration of intravenous contrast. Multiplanar CT image reconstructions and MIPs were obtained to evaluate the vascular anatomy. CONTRAST:  161mL OMNIPAQUE IOHEXOL 350 MG/ML SOLN COMPARISON:  CT chest 08/02/2013 FINDINGS: Small filling defect right lower lobe pulmonary artery compatible with small volume pulmonary embolus. No other pulmonary emboli identified. No evidence of right heart strain. Right ventricle nondilated. Pulmonary arteries normal in caliber. Negative for aortic dissection or aneurysm. Heart size upper normal. Mild pericardial thickening. No significant pericardial effusion. Numerous lung nodules bilaterally have progressed since the prior CT. These are irregular and ill-defined. The largest lesion in the right lower lobe posteriorly measures 64 x 43 mm. Several lesions are angular. Other lesions are more nodular. Given  the progression over time and history pancreatic cancer, these are likely related to metastatic disease. Right lower lobe lung cancer could also have this appearance. No pathologically enlarged hilar or mediastinal lymph nodes. Negative for pleural effusion Right adrenal mass measures 20 x 38 mm with significant growth from 10/20/2014 compatible with metastatic disease. Mild left adrenal enlargement also suggesting metastatic disease. Small amount of ascites. Review of the MIP images confirms the above findings. IMPRESSION: Small amount of pulmonary embolism right lower lobe pulmonary artery without right heart strain Numerous lung nodules have progressed from the prior study consistent with metastatic disease. Large mass right lower lobe could be related to primary bronchogenic carcinoma. Bilateral adrenal metastasis. Small amount of ascites in the upper abdomen. Electronically Signed   By: Franchot Gallo M.D.   On: 05/14/2015 14:13   Dg Chest Portable 1 View  05/14/2015  CLINICAL DATA:  Pain and swelling of the right leg.  Dyspnea. EXAM: PORTABLE CHEST 1 VIEW COMPARISON:  CT chest 08/02/2013 FINDINGS: There is focal opacity in the right lower lobe. There is mild bilateral interstitial thickening likely chronic. There is no pleural effusion or pneumothorax. The heart and mediastinal contours are unremarkable. There is a right sided Port-A-Cath with the tip projecting over the SVC. The osseous structures are unremarkable. IMPRESSION: 1. Focal opacity in the right lower lobe concerning for pneumonia versus malignancy. Followup PA and lateral chest X-ray is recommended in 3-4 weeks following trial of antibiotic therapy to ensure resolution and exclude underlying malignancy. Electronically Signed   By: Kathreen Devoid   On: 05/14/2015 13:22   I have personally reviewed and evaluated these images and lab results as part of my medical decision-making.   EKG Interpretation None     CRITICAL CARE Performed by: Forde Dandy   Total critical care time: 35 minutes  Critical care time was exclusive of separately billable procedures and treating other patients.  Critical care was necessary to treat or prevent imminent or life-threatening deterioration.  Critical care was time spent personally by me on the following activities: development of treatment plan with patient and/or surrogate as well as nursing, discussions with consultants, evaluation of patient's response to treatment, examination of patient, obtaining history from patient or surrogate, ordering and performing treatments and interventions, ordering and review of laboratory studies, ordering and review of radiographic studies, pulse oximetry and re-evaluation of patient's condition.   MDM   Final diagnoses:  Other acute pulmonary embolism without acute cor pulmonale (HCC)  Abnormal EKG   82 year old female with history of pancreatic cancer who presents with RLE swelling. On arrival is tachycardia, but normotensive, on room air.   Initial EKG  concerning for potential anterior STEMI with inferior and lateral ST depression/TWI. However, patient without chest pain and history was not concerning for that of acute MI. Discussed with Dr. Claiborne Billings from cardiology at 12:55PM. Given story inconsistent with that of acute MI, recommended ED formal ECHO. Does not recommend cardiac catheterization for at at this time.   Presentation c/f for more DVT and potential PE. Plan for US DVT scan and CT PE. With right sided PE, and no heart strain to explain EKG findings. Reassuring that first troponin is negative.   Started on heparin drip. Admitted to Dr. Allyson Sabal for ongoing cardiac work-up and treatment for PE. Discussed and and plan with patient and family. They would like her to be admitted and treated for PE despite hospice care.    Forde Dandy, MD 05/14/15 [phone]

## 2015-05-14 NOTE — Progress Notes (Signed)
  Echocardiogram 2D Echocardiogram has been performed.  Lysle Rubens 05/14/2015, 3:08 PM

## 2015-05-14 NOTE — ED Notes (Signed)
Pt with fall on Thursday, swelling and pain since Friday to R leg.

## 2015-05-14 NOTE — ED Notes (Signed)
Called STAT consult to cardiology.

## 2015-05-15 ENCOUNTER — Inpatient Hospital Stay (HOSPITAL_COMMUNITY): Payer: BLUE CROSS/BLUE SHIELD

## 2015-05-15 DIAGNOSIS — Z515 Encounter for palliative care: Secondary | ICD-10-CM

## 2015-05-15 DIAGNOSIS — I10 Essential (primary) hypertension: Secondary | ICD-10-CM

## 2015-05-15 DIAGNOSIS — I2699 Other pulmonary embolism without acute cor pulmonale: Principal | ICD-10-CM

## 2015-05-15 DIAGNOSIS — R609 Edema, unspecified: Secondary | ICD-10-CM

## 2015-05-15 DIAGNOSIS — C7800 Secondary malignant neoplasm of unspecified lung: Secondary | ICD-10-CM

## 2015-05-15 DIAGNOSIS — C259 Malignant neoplasm of pancreas, unspecified: Secondary | ICD-10-CM | POA: Diagnosis present

## 2015-05-15 DIAGNOSIS — M79609 Pain in unspecified limb: Secondary | ICD-10-CM

## 2015-05-15 DIAGNOSIS — I82401 Acute embolism and thrombosis of unspecified deep veins of right lower extremity: Secondary | ICD-10-CM

## 2015-05-15 LAB — HEPARIN LEVEL (UNFRACTIONATED)
HEPARIN UNFRACTIONATED: 0.34 [IU]/mL (ref 0.30–0.70)
HEPARIN UNFRACTIONATED: 0.21 [IU]/mL — AB (ref 0.30–0.70)

## 2015-05-15 LAB — COMPREHENSIVE METABOLIC PANEL
ALBUMIN: 2.5 g/dL — AB (ref 3.5–5.0)
ALT: 12 U/L — ABNORMAL LOW (ref 14–54)
ANION GAP: 8 (ref 5–15)
AST: 22 U/L (ref 15–41)
Alkaline Phosphatase: 62 U/L (ref 38–126)
BUN: 17 mg/dL (ref 6–20)
CHLORIDE: 106 mmol/L (ref 101–111)
CO2: 22 mmol/L (ref 22–32)
Calcium: 8.2 mg/dL — ABNORMAL LOW (ref 8.9–10.3)
Creatinine, Ser: 0.9 mg/dL (ref 0.44–1.00)
GFR calc Af Amer: >60 mL/min (ref >60)
GFR calc non Af Amer: 58 mL/min — ABNORMAL LOW (ref >60)
GLUCOSE: 111 mg/dL — AB (ref 65–99)
POTASSIUM: 3.6 mmol/L (ref 3.5–5.1)
SODIUM: 136 mmol/L (ref 135–145)
Total Bilirubin: 0.4 mg/dL (ref 0.3–1.2)
Total Protein: 6.2 g/dL — ABNORMAL LOW (ref 6.5–8.1)

## 2015-05-15 LAB — CBC
HCT: 31.4 % — ABNORMAL LOW (ref 36.0–46.0)
Hemoglobin: 10.2 g/dL — ABNORMAL LOW (ref 12.0–15.0)
MCH: 27.5 pg (ref 26.0–34.0)
MCHC: 32.5 g/dL (ref 30.0–36.0)
MCV: 84.6 fL (ref 78.0–100.0)
PLATELETS: 228 10*3/uL (ref 150–400)
RBC: 3.71 MIL/uL — ABNORMAL LOW (ref 3.87–5.11)
RDW: 16.9 % — AB (ref 11.5–15.5)
WBC: 7.9 10*3/uL (ref 4.0–10.5)

## 2015-05-15 LAB — TROPONIN I
TROPONIN I: 0.05 ng/mL — AB (ref <0.031)
Troponin I: 0.05 ng/mL — ABNORMAL HIGH (ref <0.031)

## 2015-05-15 LAB — PROTIME-INR
INR: 1.38 (ref 0.00–1.49)
PROTHROMBIN TIME: 17 s — AB (ref 11.6–15.2)

## 2015-05-15 LAB — CK: CK TOTAL: 74 U/L (ref 38–234)

## 2015-05-15 MED ORDER — OXYCODONE HCL 5 MG PO TABS
5.0000 mg | ORAL_TABLET | ORAL | Status: DC | PRN
  Administered 2015-05-15: 5 mg via ORAL
  Filled 2015-05-15: qty 2

## 2015-05-15 MED ORDER — CALCIUM CARBONATE ANTACID 500 MG PO CHEW
1.0000 | CHEWABLE_TABLET | Freq: Two times a day (BID) | ORAL | Status: DC | PRN
  Administered 2015-05-15: 200 mg via ORAL
  Filled 2015-05-15: qty 1

## 2015-05-15 MED ORDER — OXYCODONE HCL 5 MG PO TABS
5.0000 mg | ORAL_TABLET | ORAL | Status: DC | PRN
  Administered 2015-05-15 (×2): 5 mg via ORAL
  Filled 2015-05-15 (×2): qty 1

## 2015-05-15 MED ORDER — HEPARIN (PORCINE) IN NACL 100-0.45 UNIT/ML-% IJ SOLN: 750.0000 [IU]/h | INTRAMUSCULAR | Status: DC

## 2015-05-15 MED ORDER — HYDROMORPHONE HCL 1 MG/ML IJ SOLN
1.0000 mg | INTRAMUSCULAR | Status: DC | PRN
  Administered 2015-05-15 – 2015-05-17 (×5): 1 mg via INTRAVENOUS
  Filled 2015-05-15 (×5): qty 1

## 2015-05-15 MED ORDER — HEPARIN BOLUS VIA INFUSION
1000.0000 [IU] | Freq: Once | INTRAVENOUS | Status: AC
  Administered 2015-05-15: 1000 [IU] via INTRAVENOUS
  Filled 2015-05-15: qty 1000

## 2015-05-15 MED ORDER — OXYCODONE HCL 5 MG PO TABS
5.0000 mg | ORAL_TABLET | ORAL | Status: DC | PRN
Start: 2015-05-15 — End: 2015-05-17
  Administered 2015-05-15 (×2): 5 mg via ORAL
  Administered 2015-05-16 (×3): 10 mg via ORAL
  Administered 2015-05-17 (×2): 5 mg via ORAL
  Administered 2015-05-17: 10 mg via ORAL
  Filled 2015-05-15: qty 2
  Filled 2015-05-15: qty 1
  Filled 2015-05-15: qty 2
  Filled 2015-05-15 (×3): qty 1
  Filled 2015-05-15: qty 2
  Filled 2015-05-15: qty 1
  Filled 2015-05-15: qty 2

## 2015-05-15 MED ORDER — PANTOPRAZOLE SODIUM 40 MG PO TBEC
40.0000 mg | DELAYED_RELEASE_TABLET | Freq: Every day | ORAL | Status: DC
  Administered 2015-05-16 – 2015-05-17 (×2): 40 mg via ORAL
  Filled 2015-05-15 (×2): qty 1

## 2015-05-15 MED ORDER — HEPARIN (PORCINE) IN NACL 100-0.45 UNIT/ML-% IJ SOLN
850.0000 [IU]/h | INTRAMUSCULAR | Status: DC
  Administered 2015-05-15: 850 [IU]/h via INTRAVENOUS
  Filled 2015-05-15: qty 250

## 2015-05-15 MED ORDER — ENOXAPARIN SODIUM 60 MG/0.6ML ~~LOC~~ SOLN
45.0000 mg | Freq: Two times a day (BID) | SUBCUTANEOUS | Status: DC
  Administered 2015-05-15 – 2015-05-17 (×4): 45 mg via SUBCUTANEOUS
  Filled 2015-05-15 (×4): qty 0.6

## 2015-05-15 MED ORDER — CYCLOBENZAPRINE HCL 10 MG PO TABS
10.0000 mg | ORAL_TABLET | Freq: Three times a day (TID) | ORAL | Status: DC
  Administered 2015-05-15 – 2015-05-17 (×5): 10 mg via ORAL
  Filled 2015-05-15 (×5): qty 1

## 2015-05-15 MED ORDER — CYCLOBENZAPRINE HCL 10 MG PO TABS: 10.0000 mg | ORAL_TABLET | Freq: Three times a day (TID) | ORAL | Status: DC | PRN

## 2015-05-15 NOTE — Progress Notes (Signed)
ANTICOAGULATION CONSULT NOTE - Follow Up Consult  Pharmacy Consult for Heparin Indication: pulmonary embolus  Allergies  Allergen Reactions  . Oxaliplatin Itching  . Penicillins     Does not remember Has patient had a PCN reaction causing immediate rash, facial/tongue/throat swelling, SOB or lightheadedness with hypotension:  Has patient had a PCN reaction causing severe rash involving mucus membranes or skin necrosis:  Has patient had a PCN reaction that required hospitalization  Has patient had a PCN reaction occurring within the last 10 years:  If all of the above answers are "NO", then may proceed with Cep  . Tegaderm Ag Mesh [Silver]     Skin redness.   . Transderm-Scop [Scopolamine]     Had the opposite effect.    Patient Measurements: Height: 5\' 2"  (157.5 cm) Weight: 103 lb 14.4 oz (47.129 kg) IBW/kg (Calculated) : 50.1  Total Body Weight:  Heparin Dosing Weight: 47.1 kg  Vital Signs: Temp: 99.2 F (37.3 C) (02/05 2209) Temp Source: Oral (02/05 2209) BP: 99/47 mmHg (02/05 2209) Pulse Rate: 82 (02/05 2209)  Labs:  Recent Labs  05/14/15 1307 05/14/15 1313 05/14/15 1820 05/14/15 2314  HGB 10.9* 12.6  --   --   HCT 34.0* 37.0  --   --   PLT 231  --   --   --   APTT 37  --   --   --   LABPROT 15.8*  --   --   --   INR 1.24  --   --   --   HEPARINUNFRC  --   --   --  0.19*  CREATININE 0.91 0.90  --   --   TROPONINI  --   --  0.05* 0.05*   Estimated Creatinine Clearance: 35.2 mL/min (by C-G formula based on Cr of 0.9).  Medications:  Scheduled:  Infusions:   Assessment: 45 yoF to ED via EMS with LE swelling after fall on Thursday.  Hx of Pancreatic Ca with liver & lung mets s/p FOLFOX, Gemzar. Plan LE ultrasound, r/o VTE, Heparin infusion for RLL PE. 05/14/15  2314 HL=0.19 units/ml, no infusion problems or bleeding per RN  Goal of Therapy:  Heparin level 0.3-0.7 units/ml Monitor platelets by anticoagulation protocol: Yes   Plan:   Heparin bolus of  1000 units followed by infusion at 750 units/hr  Recheck HL in 8 hours  Daily Heparin level when results stable, daily CBC while on Heparin   Vicki Chavez 05/15/2015, 12:10 AM

## 2015-05-15 NOTE — Consult Note (Signed)
Consultation Note Date: 05/15/2015   Patient Name: Vicki Chavez  DOB: Aug 05, 1929  MRN: [id number]  Age / Sex: 82 y.o., female  PCP: Pcp Not In System Referring Physician: Lavina Hamman, MD  Reason for Consultation: Establishing goals of care  Vicki Chavez is a 82 yo female with metastatic pancreatic cancer admitted with right DVT and PE.   Clinical Assessment/Narrative: I met today with Vicki Chavez. She is on bedpan and having pain in her right leg even with the slightest touch. No family currently at bedside. I spoke with Vicki Chavez who explains that she was having more swelling and pain at home. Also not eating much or thriving. I ask her about how she feels about her health. She looks at me and tells me "I am ready to go be with God - I don't know how long this will take." I told her that I am not sure either but that we can help make sure she does not suffer and make everything as easy on her and her family as possible.   Her daughter and daughter-in-law returned to the room. I explained to them palliative care and the hospice social worker is with them. Daughter explains how her brother has questions and decisions for their mother weigh heavy on him. Daughter asks about continuing with hospice and options. They are planning to all meet with Dr. Posey Pronto this evening - I am unable to meet with them this late in the evening. They are looking forward to discussing options and recommendations with Dr. Posey Pronto.   Emotional support provided. Will follow and they will call with time to meet if needed. I will check in with them tomorrow.   Contacts/Participants in Discussion: Primary Decision Maker: Georgeanna Harrison   Relationship to Patient Son  SUMMARY OF RECOMMENDATIONS - Vicki Chavez is in a good place and tells me that she is ready to die - Will work to manage pain along with hospice  Code Status/Advance Care  Planning: DNR    Code Status Orders        Start     Ordered   05/14/15 1529  Do not attempt resuscitation (DNR)   Continuous    Question Answer Comment  In the event of cardiac or respiratory ARREST Do not call a "code blue"   In the event of cardiac or respiratory ARREST Do not perform Intubation, CPR, defibrillation or ACLS   In the event of cardiac or respiratory ARREST Use medication by any route, position, wound care, and other measures to relive pain and suffering. May use oxygen, suction and manual treatment of airway obstruction as needed for comfort.      05/14/15 1529    Code Status History    Date Active Date Inactive Code Status Order ID Comments User Context   07/30/2013 12:43 AM 08/02/2013 11:15 PM Full Code [id number]  Robbie Lis, MD Inpatient   03/16/2013  1:22 PM 03/18/2013  7:25 PM Full Code [id number]  Armandina Gemma, MD Inpatient   03/13/2013  6:41 AM 03/16/2013  1:22 PM Full Code [id number]  Berle Mull, MD Inpatient    Advance Directive Documentation        Most Recent Value   Type of Advance Directive  Out of facility DNR (pink MOST or yellow form)   Pre-existing out of facility DNR order (yellow form or pink MOST form)  Yellow form placed in chart (order not valid for inpatient use)   "MOST" Form in Place?  Symptom Management:   Pain: Continue methadone 3 mg every 8 hours. Allow oxycodone 5-10 mg every 3 hours prn. Dilaudid 1 mg every 2 hours prn.   LBM yesterday. No constipation.   Palliative Prophylaxis:   Bowel Regimen, Delirium Protocol and Frequent Pain Assessment  Psycho-social/Spiritual:  Support System: Strong Desire for further Chaplaincy support:yes Additional Recommendations: Caregiving  Support/Resources, Education on Hospice and Grief/Bereavement Support  Prognosis: Likely weeks to months.   Discharge Planning: Home with Hospice   Chief Complaint/ Primary Diagnoses: Present on Admission:  . HTN (hypertension)  I have reviewed  the medical record, interviewed the patient and family, and examined the patient. The following aspects are pertinent.  Past Medical History  Diagnosis Date  . Hypertension   . High cholesterol   . Osteoporosis   . Prediabetes   . Gall stones   . Cancer ([place])   . Pancreatic cancer [facility])    Social History   Social History  . Marital Status: Married    Spouse Name: N/A  . Number of Children: N/A  . Years of Education: N/A   Social History Main Topics  . Smoking status: Never Smoker   . Smokeless tobacco: None  . Alcohol Use: No  . Drug Use: No  . Sexual Activity: Not Asked   Other Topics Concern  . None   Social History Narrative   History reviewed. No pertinent family history. Scheduled Meds: . docusate sodium  100 mg Oral BID  . lipase/protease/amylase  2 capsule Oral TID WC  . LORazepam  0.5 mg Oral BID  . methadone  3 mg Oral Q8H  . pantoprazole  20 mg Oral Daily  . polyethylene glycol  17 g Oral Daily  . senna-docusate  3-4 tablet Oral BID  . sodium chloride flush  3 mL Intravenous Q12H   Continuous Infusions: . heparin 750 Units/hr (05/15/15 0019)   PRN Meds:.acetaminophen **OR** acetaminophen, cyclobenzaprine, HYDROmorphone (DILAUDID) injection, levalbuterol, ondansetron **OR** ondansetron (ZOFRAN) IV, oxyCODONE, simethicone, sodium chloride flush Medications Prior to Admission:  Prior to Admission medications   Medication Sig Start Date End Date Taking? Authorizing Provider  acetaminophen (TYLENOL) 500 MG tablet Take 500 mg by mouth every 6 (six) hours as needed for moderate pain.   Yes Historical Provider, MD  calcium-vitamin D (OSCAL WITH D) 500-200 MG-UNIT per tablet Take 1 tablet by mouth 2 (two) times daily.   Yes Historical Provider, MD  furosemide (LASIX) 20 MG tablet Take 40 mg by mouth daily. 04/06/15  Yes Historical Provider, MD  lidocaine (LIDODERM) 5 % PUT PATCH ON AFFECTED AREA FOR 12 HRS AND REMOVE FOR 12 HR 04/07/15  Yes Historical Provider,  MD  LORazepam (ATIVAN) 0.5 MG tablet Take 0.5 mg by mouth 2 (two) times daily. 04/25/15 05/25/15 Yes Historical Provider, MD  methadone (DOLOPHINE) 1 mg/ml oral solution Take 3 mg/kg by mouth every 8 (eight) hours.   Yes Historical Provider, MD  methadone (DOLOPHINE) 10 MG/5ML solution Take 3 mg by mouth every 8 (eight) hours.   Yes Historical Provider, MD  ondansetron (ZOFRAN) 4 MG tablet Take 1 tablet (4 mg total) by mouth every 6 (six) hours as needed for nausea. Patient taking differently: Take 4 mg by mouth 2 (two) times daily.  08/02/13  Yes [name], DO  oxyCODONE (OXY IR/ROXICODONE) 5 MG immediate release tablet Take 2.5-5 mg by mouth every 4 (four) hours as needed for severe pain (takes half tablet during the day takes whole tablet at bedtime).  Yes Historical Provider, MD  Pancrelipase, Lip-Prot-Amyl, (ZENPEP PO) Take 2 capsules by mouth 3 (three) times daily.   Yes Historical Provider, MD  pantoprazole (PROTONIX) 20 MG tablet Take 20 mg by mouth daily. 09/01/13  Yes Historical Provider, MD  polyethylene glycol (MIRALAX / GLYCOLAX) packet Take 17 g by mouth daily.   Yes Historical Provider, MD  Probiotic Product (PROBIOTIC & ACIDOPHILUS EX ST) CAPS Take 1 capsule by mouth daily.   Yes Historical Provider, MD  senna-docusate (SENNA PLUS) 8.6-50 MG tablet Take 3-4 tablets by mouth 2 (two) times daily. Takes three tablet in the morning and four tablets at dinner.   Yes Historical Provider, MD  TURMERIC PO Take 1 tablet by mouth daily.   Yes Historical Provider, MD  atenolol (TENORMIN) 25 MG tablet Take 1 tablet (25 mg total) by mouth 2 (two) times daily. Patient not taking: Reported on 05/14/2015 10/23/14   Linton Flemings, MD  docusate sodium 100 MG CAPS Take 100 mg by mouth 2 (two) times daily. Patient not taking: Reported on 05/14/2015 08/02/13   [name], DO  simethicone (MYLICON) 80 MG chewable tablet Chew 1 tablet (80 mg total) by mouth every 6 (six) hours as needed for  flatulence. Patient not taking: Reported on 05/14/2015 08/02/13   [name], DO    Review of Systems  Constitutional: Positive for appetite change.  Respiratory: Negative for shortness of breath.   Cardiovascular: Positive for leg swelling.  Neurological: Positive for weakness.    Physical Exam  Constitutional: She is oriented to person, place, and time. She appears well-developed. She appears cachectic.  HENT:  Temporal muscle wasting  Cardiovascular: Normal rate.   Respiratory: Effort normal. No accessory muscle usage. No tachypnea. No respiratory distress.  GI: Normal appearance.  Neurological: She is alert and oriented to person, place, and time.    Vital Signs: BP 113/58 mmHg  Pulse 83  Temp(Src) 98.3 F (36.8 C) (Oral)  Resp 20  Ht 5' 2"  (1.575 m)  Wt 47.129 kg (103 lb 14.4 oz)  BMI 19.00 kg/m2  SpO2 98%  SpO2: SpO2: 98 % O2 Device:SpO2: 98 % O2 Flow Rate: .   IO: Intake/output summary:  Intake/Output Summary (Last 24 hours) at 05/15/15 1349 Last data filed at 05/15/15 1200  Gross per 24 hour  Intake  93.23 ml  Output    465 ml  Net -371.77 ml    LBM:   Baseline Weight: Weight: 47.129 kg (103 lb 14.4 oz) Most recent weight: Weight: 47.129 kg (103 lb 14.4 oz)      Palliative Assessment/Data:    Additional Data Reviewed:  CBC:    Component Value Date/Time   WBC 7.9 05/15/2015 0527   HGB 10.2* 05/15/2015 0527   HCT 31.4* 05/15/2015 0527   PLT 228 05/15/2015 0527   MCV 84.6 05/15/2015 0527   NEUTROABS 5.3 05/14/2015 1307   LYMPHSABS 1.0 05/14/2015 1307   MONOABS 0.7 05/14/2015 1307   EOSABS 0.0 05/14/2015 1307   BASOSABS 0.0 05/14/2015 1307   Comprehensive Metabolic Panel:    Component Value Date/Time   NA 136 05/15/2015 0527   K 3.6 05/15/2015 0527   CL 106 05/15/2015 0527   CO2 22 05/15/2015 0527   BUN 17 05/15/2015 0527   CREATININE 0.90 05/15/2015 0527   CREATININE 0.52 03/25/2013 0753   GLUCOSE 111* 05/15/2015 0527   CALCIUM 8.2*  05/15/2015 0527   AST 22 05/15/2015 0527   ALT 12* 05/15/2015 0527   ALKPHOS  62 05/15/2015 0527   BILITOT 0.4 05/15/2015 0527   PROT 6.2* 05/15/2015 0527   ALBUMIN 2.5* 05/15/2015 0527     Time In: 1300 Time Out: 1400 Time Total: 38mn Greater than 50%  of this time was spent counseling and coordinating care related to the above assessment and plan.  Signed by: PPershing Proud NP  APershing Proud NP  21/10/4713 1:49 PM  Please contact Palliative Medicine Team phone at [phone] questions and concerns.

## 2015-05-15 NOTE — Progress Notes (Signed)
ANTICOAGULATION CONSULT NOTE - Follow Up Consult  Pharmacy Consult for Lovenox Indication: pulmonary embolus and DVT  See pharmacist note from earlier today for full assessment. MD now requesting heparin infusion be changed to Lovenox.   Patient only weighing 47kg with Scr 0.9 (estimated CrCl ~19ml/min).    Goal of Therapy:  4hr anti-Xa level 0.5-1.0 Monitor platelets by anticoagulation protocol: Yes   Plan:   D/C heparin & heparin labs  Start Lovenox 45mg  sq q12h.  May consider change to 1.5mg /kg (70mg ) q24h if plan to continue Lovenox at discharge.  Monitor CBC   F/U plan for long-term anticoagulation (pending conversation with oncologist at [facility])  Netta Cedars, PharmD, BCPS Pager: [phone] 05/15/2015 7:05 PM

## 2015-05-15 NOTE — Progress Notes (Signed)
ANTICOAGULATION CONSULT NOTE - Follow Up Consult  Pharmacy Consult for Heparin Indication: pulmonary embolus and DVT  Patient Measurements: Height: 5\' 2"  (157.5 cm) Weight: 103 lb 14.4 oz (47.129 kg) IBW/kg (Calculated) : 50.1  Heparin Dosing Weight: 47.1 kg  Vital Signs: Temp: 98.3 F (36.8 C) (02/06 0520) Temp Source: Oral (02/06 0520) BP: 113/58 mmHg (02/06 0520) Pulse Rate: 83 (02/06 0520)  Labs:  Recent Labs  05/14/15 1307 05/14/15 1313 05/14/15 1820 05/14/15 2314 05/15/15 0527 05/15/15 0528 05/15/15 0859  HGB 10.9* 12.6  --   --  10.2*  --   --   HCT 34.0* 37.0  --   --  31.4*  --   --   PLT 231  --   --   --  228  --   --   APTT 37  --   --   --   --   --   --   LABPROT 15.8*  --   --   --  17.0*  --   --   INR 1.24  --   --   --  1.38  --   --   HEPARINUNFRC  --   --   --  0.19*  --   --  0.34  CREATININE 0.91 0.90  --   --  0.90  --   --   TROPONINI  --   --  0.05* 0.05*  --  0.05*  --    Estimated Creatinine Clearance: 35.2 mL/min (by C-G formula based on Cr of 0.9).  Infusions:  . sodium chloride 50 mL/hr at 05/14/15 1708  . heparin 750 Units/hr (05/15/15 0019)   Assessment: 49 yoF to ED via EMS with LE swelling after fall on Thursday.  Hx of Pancreatic Ca with liver & lung mets s/p FOLFOX, Gemzar. CT angio positive for PE and LE ultrasound also positive for DVT in RLE.  Pharmacy is consulted to dose heparin infusion.    Today, 05/15/2015: Heparin level: 0.34, therapeutic CBC: Hgb decreased to 10.2, Plt remain WNL. No bleeding or complications reported by pt or RN Diet: regular Drug-drug interactions: None noted  Goal of Therapy:  Heparin level 0.3-0.7 units/ml Monitor platelets by anticoagulation protocol: Yes   Plan:   Continue Heparin infusion at 750 units/hr  Recheck HL in 8 hours to confirm therapeutic level  Daily Heparin level when results stable, daily CBC while on Heparin  Follow up long-term plan for anticoagulation.   Gretta Arab PharmD, BCPS Pager [phone] 05/15/2015 9:41 AM

## 2015-05-15 NOTE — Evaluation (Signed)
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 Pt currently with functional limitations due to the deficits listed below (see PT Problem List).  Pt will benefit from skilled PT to increase their independence and safety with mobility to allow discharge to the venue listed below.   Pt will benefit from continued PT: today eval significantly limited d/t severe pain,  instructed dtr in gait with RW and step to gait, dtr return demo's technique; recommend a few visits HHPT although it is noted that PC Consult pending (pt Had Hospice at home prior to adm); will follow    Follow Up Recommendations Home health PT;Supervision for mobility/OOB    Equipment Recommendations  Wheelchair (measurements PT) (with ELRs)    Recommendations for Other Services       Precautions / Restrictions Precautions Precautions: Fall Restrictions Weight Bearing Restrictions: No      Mobility  Bed Mobility               General bed mobility comments: pt attempted to move but unable to tol mobility d/t pain RLE  Transfers                    Ambulation/Gait                Stairs            Wheelchair Mobility    Modified Rankin (Stroke Patients Only)       Balance                                             Pertinent Vitals/Pain Pain Assessment: Faces Faces Pain Scale: Hurts worst Pain Location: R LE and abd Pain Descriptors / Indicators: Grimacing;Guarding Pain Intervention(s): Limited activity within  patient's tolerance;Monitored during session;Premedicated before session    Home Living Family/patient expects to be discharged to:: Private residence Living Arrangements: Children Available Help at Discharge: Family (son and dtr in law) Type of Home: Apartment Home Access: Level entry     Home Layout: One level Home Equipment: Bedside commode;Wheelchair - manual Additional Comments: supposed to deliver walker; has her husband's hospital bed and w/c does not have ELRs    Prior Function           Comments: amb I'ly until Thursday, requiring assist d/t      Hand Dominance        Extremity/Trunk Assessment   Upper Extremity Assessment: Defer to OT evaluation           Lower Extremity Assessment: RLE deficits/detail         Communication   Communication: No difficulties  Cognition Arousal/Alertness: Awake/alert Behavior During Therapy: WFL for tasks assessed/performed Overall Cognitive Status: Within Functional Limits for tasks assessed                      General Comments      Exercises        Assessment/Plan    PT Assessment Patient  needs continued PT services  PT Diagnosis Acute pain   PT Problem List Decreased range of motion;Decreased activity tolerance;Decreased mobility;Pain  PT Treatment Interventions DME instruction;Gait training;Functional mobility training;Therapeutic activities;Therapeutic exercise;Patient/family education   PT Goals (Current goals can be found in the Care Plan section) Acute Rehab PT Goals Patient Stated Goal: less pain PT Goal Formulation: With patient Time For Goal Achievement: 05/22/15 Potential to Achieve Goals: Fair    Frequency Min 3X/week   Barriers to discharge        Co-evaluation               End of Session   Activity Tolerance: Patient limited by pain Patient left: in bed;with call bell/phone within reach;with bed alarm set;with family/visitor present Nurse Communication: Mobility  status         Time: [id number] PT Time Calculation (min) (ACUTE ONLY): 20 min   Charges:   PT Evaluation $PT Eval Low Complexity: 1 Procedure     PT G Codes:        Raul Winterhalter May 28, 2015, 1:30 PM

## 2015-05-15 NOTE — Progress Notes (Signed)
VASCULAR LAB PRELIMINARY  PRELIMINARY  PRELIMINARY  PRELIMINARY  Bilateral lower extremity venous duplex completed.    Preliminary report:  Right:  Positive for DVT noted throughout the lower extremity with minimal to absence of flow. Also noted is evidence of superficial thrombosis of the proximal greater saphenous vein.  No Baker's cyst. Left:  No evidence of DVT, superficial thrombosis, or Baker's cyst.  Nesa Distel, RVS 05/15/2015, 10:17 AM

## 2015-05-15 NOTE — Progress Notes (Signed)
[facility] Progress Note  Patient: Vicki Chavez [id number]   PCP: Pcp Not In System DOB: Sep 04, 1929   DOA: 05/14/2015   DOS: 05/15/2015   Date of Service: the patient was seen and examined on 05/15/2015  Subjective: Patient continues to have significant tenderness in the right lower legs. Nutrition: Tolerating oral diet but minimal oral intake Activity: Significant pain with movement Last BM: Prior to arrival  Assessment and Plan: 1. Acute pulmonary embolism ([place]) The patient is presenting with complains of swelling of the right lower extremity. This has been progressed over last 4 days. Ultrasound is positive for DVT in water lower extremity. Patient has limited circulation. CT chest PE protocol is also positive for small PE without any RV strain. Echocardiogram does not show any acute abnormality. Patient was on heparin and will be switched to Lovenox per pharmacy recommendation. As per my discussion with the patient as well as the family given her prognosis invasive therapeutic options including TPA or IVC are less likely beneficial. Given the pain is not well controlled we will increase the pain regimen.  2. Primary pancreatic cancer with metastasis to multiple sites including longer. Suspect her pulmonary mass for primary bronchogenic carcinoma. Patient's CT chest PE protocol is also positive for a new large mass 6 cm in size concerning for a primary bronchogenic carcinoma. The mass was not present on imaging done on July 16. Given the rapid progression metastasis is more likely. I discussed with the patient as well as family at length about possible outcome. Also discussed regarding options with hospice as well as goals of care. Given the rapid progression and recommended of the patient should continue to remain with hospice to continue symptomatic management as well as comfort measures. Family at present continues to request treating the blood clot with antegrade relation  although is agreeing with no role of invasive measures. Patient continues to remain DNR/DNI. Most important for the patient is pain control as well as staying with the family. Prognosis is significantly guarded with few weeks. Foley catheter can be considered for comfort given significant pain with movement.   3. Urinary obstruction with nephrostomy tube. Patient has a nephrostomy tube as well while she is concerned about the output from the tube had recommended that we'll continue to monitor. CKs not significantly elevated.  Nutrition: Regular diet Advance goals of care discussion: DNR/DNI  Brief Summary of Hospitalization:  Daily update, Procedures: Echocardiogram, lower extended Doppler Consultants: Phone consultation with vascular surgery, palliative care Antibiotics: Anti-infectives    None       Family Communication: family was present at bedside, at the time of interview.  Opportunity was given to ask question and all questions were answered satisfactorily.   Disposition:  Barriers to safe discharge: Symptom management.   Intake/Output Summary (Last 24 hours) at 05/15/15 2001 Last data filed at 05/15/15 1200  Gross per 24 hour  Intake  93.23 ml  Output    465 ml  Net -371.77 ml   Filed Weights   05/14/15 1506 05/14/15 1800  Weight: 47.129 kg (103 lb 14.4 oz) 47.129 kg (103 lb 14.4 oz)    Objective: Physical Exam: Filed Vitals:   05/14/15 1800 05/14/15 2209 05/15/15 0520 05/15/15 1500  BP:  99/47 113/58 140/56  Pulse:  82 83 87  Temp:  99.2 F (37.3 C) 98.3 F (36.8 C) 99.6 F (37.6 C)  TempSrc:  Oral Oral Oral  Resp:  20 20 20   Height: 5\' 2"  (1.575 m)  Weight: 47.129 kg (103 lb 14.4 oz)     SpO2:  97% 98% 98%     General: Appear in marked distress, no Rash; Oral Mucosa moist. Cardiovascular: S1 and S2 Present, no Murmur, no JVD Respiratory: Bilateral Air entry present and Clear to Auscultation, no Crackles, no wheezes Abdomen: Bowel Sound  present, Soft and no tenderness Extremities: Right Pedal edema, right calf tenderness Neurology: Grossly no focal neuro deficit.  Data Reviewed: CBC:  Recent Labs Lab 05/14/15 1307 05/14/15 1313 05/15/15 0527  WBC 6.9  --  7.9  NEUTROABS 5.3  --   --   HGB 10.9* 12.6 10.2*  HCT 34.0* 37.0 31.4*  MCV 84.8  --  84.6  PLT 231  --  [id number]   Basic Metabolic Panel:  Recent Labs Lab 05/14/15 1307 05/14/15 1313 05/14/15 1820 05/15/15 0527  NA 134* 135  --  136  K 3.8 3.7  --  3.6  CL 101 102  --  106  CO2 23  --   --  22  GLUCOSE 99 96  --  111*  BUN 20 20  --  17  CREATININE 0.91 0.90  --  0.90  CALCIUM 8.7*  --   --  8.2*  MG  --   --  1.5*  --    Liver Function Tests:  Recent Labs Lab 05/14/15 1307 05/15/15 0527  AST 28 22  ALT 14 12*  ALKPHOS 73 62  BILITOT 0.6 0.4  PROT 6.9 6.2*  ALBUMIN 2.9* 2.5*   No results for input(s): LIPASE, AMYLASE in the last 168 hours. No results for input(s): AMMONIA in the last 168 hours.  Cardiac Enzymes:  Recent Labs Lab 05/14/15 1820 05/14/15 2314 05/15/15 0528  CKTOTAL  --   --  74  TROPONINI 0.05* 0.05* 0.05*    BNP (last 3 results)  Recent Labs  05/14/15 1330  BNP 116.1*    CBG: No results for input(s): GLUCAP in the last 168 hours.  No results found for this or any previous visit (from the past 240 hour(s)).   Studies: Dg Foot 2 Views Left  05/15/2015  CLINICAL DATA:  Fall, left foot pain. EXAM: LEFT FOOT - 2 VIEW COMPARISON:  None. FINDINGS: There is angulation of the left third toe laterally without visible fracture. The third MTP joint is mildly subluxed laterally, question chronic versus acute. Recommend clinical correlation for pain in this area and new versus old angulation deformity. No fracture visualized. Soft tissues are intact. IMPRESSION: Lateral angulation of the left third toe at the MTP joint, question subluxation. This could be chronic or acute. No fracture. Electronically Signed   By: Rolm Baptise M.D.   On: 05/15/2015 12:48   Dg Hip Unilat With Pelvis 2-3 Views Right  05/15/2015  CLINICAL DATA:  Fall, right hip pain. EXAM: DG HIP (WITH OR WITHOUT PELVIS) 2-3V RIGHT COMPARISON:  None. FINDINGS: No acute bony abnormality. Specifically, no fracture, subluxation, or dislocation. Soft tissues are intact. Early degenerative changes with early joint space narrowing and spurring in both hips. SI joints are symmetric and unremarkable. Contrast material noted within the urinary bladder. IMPRESSION: No acute bony abnormality. Electronically Signed   By: Rolm Baptise M.D.   On: 05/15/2015 12:46     Scheduled Meds: . cyclobenzaprine  10 mg Oral TID  . docusate sodium  100 mg Oral BID  . enoxaparin (LOVENOX) injection  45 mg Subcutaneous Q12H  . lipase/protease/amylase  2 capsule Oral TID WC  .  LORazepam  0.5 mg Oral BID  . methadone  3 mg Oral Q8H  . [START ON 05/16/2015] pantoprazole  40 mg Oral Daily  . polyethylene glycol  17 g Oral Daily  . senna-docusate  3-4 tablet Oral BID  . sodium chloride flush  3 mL Intravenous Q12H   Continuous Infusions:  PRN Meds: acetaminophen **OR** acetaminophen, calcium carbonate, HYDROmorphone (DILAUDID) injection, levalbuterol, ondansetron **OR** ondansetron (ZOFRAN) IV, oxyCODONE, simethicone, sodium chloride flush  Time spent: 30 minutes  Author: Berle Mull, MD [facility] Pager: [phone] 05/15/2015 8:01 PM  If 7PM-7AM, please contact night-coverage at [email or web address], password [email or web address]

## 2015-05-15 NOTE — Progress Notes (Signed)
ANTICOAGULATION CONSULT NOTE - Follow Up Consult  Pharmacy Consult for Heparin Indication: pulmonary embolus and DVT  See pharmacist note from earlier today for full assessment. Heparin level obtained this evening = 0.21, now subtherapeutic despite therapeutic level this AM on same rate of 750 units/hr.  Goal of Therapy:  Heparin level 0.3-0.7 units/ml Monitor platelets by anticoagulation protocol: Yes   Plan:   Increase Heparin infusion to 850 units/hr  Recheck HL in 8 hours after rate change   Hershal Coria, PharmD, BCPS Pager: [phone] 05/15/2015 6:36 PM

## 2015-05-15 NOTE — Progress Notes (Signed)
[facility] Work note Patient is currently receiving services, yet has been on services for a short period of time. She lives with her son and daughter-in-law. Her daughter is in town from [place] and is staying during hospitalization. Family are wanting a return home after hospitalization and will resume care there.  Patient was quite drowsy during visit, but appeared comfortable. Family asked questions about hospice philosophy and benefits as it relates to insurance coverage. Palliative Medicine NP, Elmo Putt accompanied LCSW during visit.  LCSW to continue to follow.  [name], [place]

## 2015-05-15 NOTE — Progress Notes (Signed)
Inpatient [facility] [place] [facility] RN Visit. GIP related admission to her [facility] DX of Pancreatic Cancer, pt. Is a DNR code status. Pt. Admitted to hospital yesterday after 2 days of pain and swelling in her RLE. Pt. Seen in room lying on her right side listening to her IPad with two of her daughters at the bedside. Pt. Reports her right leg is very painful and remains swollen. Patient has a Heparin infusion at 7.5 ml/hr. Infusing into her left arm. Pt. Is alert and oriented and reports she has no appetite. Pt. Is receiving PO Tylenol, Methadone, PO Oxycodone and IV Dilaudid for pain. Resps regular and unlabored on room air.  Transfer summary and med list placed on shadow chart. [facility] will continue to monitor pt. daily. Please call with any hospice questions.  [facility] Liaison [phone]

## 2015-05-16 LAB — BASIC METABOLIC PANEL
ANION GAP: 6 (ref 5–15)
BUN: 21 mg/dL — AB (ref 6–20)
CHLORIDE: 107 mmol/L (ref 101–111)
CO2: 24 mmol/L (ref 22–32)
Calcium: 8.4 mg/dL — ABNORMAL LOW (ref 8.9–10.3)
Creatinine, Ser: 0.82 mg/dL (ref 0.44–1.00)
GFR calc Af Amer: >60 mL/min (ref >60)
Glucose, Bld: 87 mg/dL (ref 65–99)
POTASSIUM: 3.8 mmol/L (ref 3.5–5.1)
SODIUM: 137 mmol/L (ref 135–145)

## 2015-05-16 MED ORDER — NYSTATIN 100000 UNIT/ML MT SUSP
5.0000 mL | Freq: Four times a day (QID) | OROMUCOSAL | Status: DC
  Administered 2015-05-16 – 2015-05-17 (×6): 500000 [IU] via ORAL
  Filled 2015-05-16 (×6): qty 5

## 2015-05-16 MED ORDER — MAGIC MOUTHWASH W/LIDOCAINE: 15.0000 mL | Freq: Four times a day (QID) | ORAL | Status: DC

## 2015-05-16 NOTE — Progress Notes (Signed)
Methadone solution wasted with Chloe RN and Lanette [place], Pharmacist Beth notified.

## 2015-05-16 NOTE — Progress Notes (Signed)
[facility] Progress Note  Patient: Vicki Chavez [id number]   PCP: Pcp Not In System DOB: [birth year]   DOA: 05/14/2015   DOS: 05/16/2015   Date of Service: the patient was seen and examined on 05/16/2015  Subjective: Pain is well tolerated and the patient was able to sleep last 1 and adequately. Nutrition: Tolerating oral diet but minimal oral intake Activity: Significant pain with movement Last BM: 05/15/2015  Assessment and Plan: 1. Acute pulmonary embolism ([place]) The patient is presenting with complains of swelling of the right lower extremity. This has been progressed over last 4 days. Ultrasound is positive for DVT in water lower extremity. Patient has limited circulation. CT chest PE protocol is also positive for small PE without any RV strain. Echocardiogram does not show any acute abnormality. Continue with Lovenox per pharmacy As per my discussion with the patient as well as the family given her prognosis invasive therapeutic options including TPA or IVC are less likely beneficial. Given the pain is not well controlled we will increase the pain regimen.  2. Primary pancreatic cancer with metastasis to multiple sites including longer. Suspect her pulmonary mass for primary bronchogenic carcinoma. Patient's CT chest PE protocol is also positive for a new large mass 6 cm in size concerning for a primary bronchogenic carcinoma. The mass was not present on imaging done on July 16. Given the rapid progression metastasis is more likely. I discussed with the patient as well as family at length about possible outcome. Also discussed regarding options with hospice as well as goals of care. Given the rapid progression i recommended that the patient should continue to remain with hospice to continue symptomatic management as well as comfort measures. Family at present continues to request treating the blood clot with anticoagulation although is agreeing with no role of invasive  measures. Patient continues to remain DNR/DNI. Most important for the patient is pain control as well as staying with the family. Prognosis is significantly guarded with few weeks. Foley catheter can be considered for comfort given significant pain with movement.  I placed a phone call with [facility] oncologist and have not received a call back yet. We will discuss with our oncologist if we do not receive a call tomorrow.  3. Urinary obstruction with nephrostomy tube. Patient has a nephrostomy tube as well while she is concerned about the output from the tube had recommended that we'll continue to monitor. CKs not significantly elevated.  Nutrition: Regular diet Advance goals of care discussion: DNR/DNI  Brief Summary of Hospitalization:  Daily update, Procedures: Echocardiogram, lower extended Doppler Consultants: Phone consultation with vascular surgery, palliative care Antibiotics: Anti-infectives    None       Family Communication: family was present at bedside, at the time of interview.  Opportunity was given to ask question and all questions were answered satisfactorily.   Disposition:  Barriers to safe discharge: Symptom management.   Intake/Output Summary (Last 24 hours) at 05/16/15 1833 Last data filed at 05/16/15 1400  Gross per 24 hour  Intake 347.33 ml  Output    260 ml  Net  87.33 ml   Filed Weights   05/14/15 1506 05/14/15 1800  Weight: 47.129 kg (103 lb 14.4 oz) 47.129 kg (103 lb 14.4 oz)    Objective: Physical Exam: Filed Vitals:   05/15/15 1500 05/15/15 2209 05/16/15 0520 05/16/15 1323  BP: 140/56 109/46 115/51 122/57  Pulse: 87 76 77 75  Temp: 99.6 F (37.6 C) 97.9 F (36.6 C)  98.3 F (36.8 C) 98.5 F (36.9 C)  TempSrc: Oral Oral Oral Oral  Resp: 20 18 18 18   Height:      Weight:      SpO2: 98% 99% 95% 98%    General: Appear in marked distress, no Rash; Oral Mucosa moist. Cardiovascular: S1 and S2 Present, no Murmur, no  JVD Respiratory: Bilateral Air entry present and Clear to Auscultation, no Crackles, no wheezes Abdomen: Bowel Sound present, Soft and no tenderness Extremities: Right Pedal edema, right calf tenderness  Data Reviewed: CBC:  Recent Labs Lab 05/14/15 1307 05/14/15 1313 05/15/15 0527  WBC 6.9  --  7.9  NEUTROABS 5.3  --   --   HGB 10.9* 12.6 10.2*  HCT 34.0* 37.0 31.4*  MCV 84.8  --  84.6  PLT 231  --  [id number]   Basic Metabolic Panel:  Recent Labs Lab 05/14/15 1307 05/14/15 1313 05/14/15 1820 05/15/15 0527 05/16/15 0410  NA 134* 135  --  136 137  K 3.8 3.7  --  3.6 3.8  CL 101 102  --  106 107  CO2 23  --   --  22 24  GLUCOSE 99 96  --  111* 87  BUN 20 20  --  17 21*  CREATININE 0.91 0.90  --  0.90 0.82  CALCIUM 8.7*  --   --  8.2* 8.4*  MG  --   --  1.5*  --   --    Liver Function Tests:  Recent Labs Lab 05/14/15 1307 05/15/15 0527  AST 28 22  ALT 14 12*  ALKPHOS 73 62  BILITOT 0.6 0.4  PROT 6.9 6.2*  ALBUMIN 2.9* 2.5*   No results for input(s): LIPASE, AMYLASE in the last 168 hours. No results for input(s): AMMONIA in the last 168 hours.  Cardiac Enzymes:  Recent Labs Lab 05/14/15 1820 05/14/15 2314 05/15/15 0528  CKTOTAL  --   --  74  TROPONINI 0.05* 0.05* 0.05*    BNP (last 3 results)  Recent Labs  05/14/15 1330  BNP 116.1*    CBG: No results for input(s): GLUCAP in the last 168 hours.  No results found for this or any previous visit (from the past 240 hour(s)).   Studies: No results found.   Scheduled Meds: . cyclobenzaprine  10 mg Oral TID  . docusate sodium  100 mg Oral BID  . enoxaparin (LOVENOX) injection  45 mg Subcutaneous Q12H  . lipase/protease/amylase  2 capsule Oral TID WC  . LORazepam  0.5 mg Oral BID  . methadone  3 mg Oral Q8H  . nystatin  5 mL Oral QID  . pantoprazole  40 mg Oral Daily  . polyethylene glycol  17 g Oral Daily  . senna-docusate  3-4 tablet Oral BID  . sodium chloride flush  3 mL Intravenous Q12H    Continuous Infusions:  PRN Meds: acetaminophen **OR** acetaminophen, calcium carbonate, HYDROmorphone (DILAUDID) injection, levalbuterol, ondansetron **OR** ondansetron (ZOFRAN) IV, oxyCODONE, simethicone, sodium chloride flush  Time spent: 30 minutes  Author: Berle Mull, MD [facility] Pager: [phone] 05/16/2015 6:33 PM  If 7PM-7AM, please contact night-coverage at [email or web address], password [facility]

## 2015-05-16 NOTE — Progress Notes (Addendum)
Inpatient [facility] [place] [facility] RN Visit.  GIP related, not covered admission to her [facility] DX of Pancreatic Cancer. Patient is a DNR. Patient seen in room, sitting up eating breakfast.  She reports she recently had a dose of 10mg  Oxycodone and is feeling better.  Daughter is at bedside and is hopeful for patient to go back home with hospice, once her pain is better control. Patient is on RA with O2 sats at 95% and respirations at 18.  She appears comfortable and denies any shortness of breath.  She has been eating approximately 25% of meals. She has had 2 doses of 1 mg IV Dilaudid, 2 doses of 650 mg Tylenol and 3 doses of 5-10mg  Oxycodone IR for pain in the past 24 hours, per chart review. Patient and daughter denied any needs. [facility] will continue to follow and anticipate any discharge needs.  Please call with any questions.  Freddi Starr, RN BSN [facility] Liaison [phone]

## 2015-05-17 DIAGNOSIS — Z515 Encounter for palliative care: Secondary | ICD-10-CM

## 2015-05-17 MED ORDER — OXYCODONE HCL 5 MG PO TABS: 5.0000 mg | ORAL_TABLET | ORAL | Status: AC | PRN

## 2015-05-17 MED ORDER — ENOXAPARIN SODIUM 80 MG/0.8ML ~~LOC~~ SOLN: 1.5000 mg/kg | SUBCUTANEOUS | Status: DC

## 2015-05-17 MED ORDER — LIDOCAINE VISCOUS 2 % MT SOLN
15.0000 mL | OROMUCOSAL | Status: DC | PRN
  Filled 2015-05-17: qty 15

## 2015-05-17 MED ORDER — HEPARIN SOD (PORK) LOCK FLUSH 100 UNIT/ML IV SOLN
500.0000 [IU] | INTRAVENOUS | Status: DC
  Filled 2015-05-17: qty 5

## 2015-05-17 MED ORDER — HEPARIN SOD (PORK) LOCK FLUSH 100 UNIT/ML IV SOLN
500.0000 [IU] | INTRAVENOUS | Status: DC | PRN
  Administered 2015-05-17: 500 [IU]
  Filled 2015-05-17: qty 5

## 2015-05-17 MED ORDER — LIDOCAINE VISCOUS 2 % MT SOLN: 15.0000 mL | Freq: Four times a day (QID) | OROMUCOSAL | Status: AC

## 2015-05-17 MED ORDER — ENOXAPARIN (LOVENOX) PATIENT EDUCATION KIT
PACK | Freq: Once | Status: AC
  Administered 2015-05-17: 17:00:00
  Filled 2015-05-17: qty 1

## 2015-05-17 MED ORDER — LIDOCAINE VISCOUS 2 % MT SOLN
15.0000 mL | Freq: Four times a day (QID) | OROMUCOSAL | Status: DC
  Administered 2015-05-17: 15 mL via OROMUCOSAL
  Filled 2015-05-17 (×3): qty 15

## 2015-05-17 MED ORDER — CYCLOBENZAPRINE HCL 10 MG PO TABS: 10.0000 mg | ORAL_TABLET | Freq: Three times a day (TID) | ORAL | Status: AC

## 2015-05-17 MED ORDER — FUROSEMIDE 20 MG PO TABS: 20.0000 mg | ORAL_TABLET | Freq: Every day | ORAL | Status: AC | PRN

## 2015-05-17 MED ORDER — ENOXAPARIN SODIUM 80 MG/0.8ML ~~LOC~~ SOLN: 70.0000 mg | SUBCUTANEOUS | Status: AC

## 2015-05-17 MED ORDER — NYSTATIN 100000 UNIT/ML MT SUSP: 5.0000 mL | Freq: Four times a day (QID) | OROMUCOSAL | Status: AC

## 2015-05-17 NOTE — Discharge Summary (Signed)
[facility] Discharge Summary   Patient: Vicki Chavez [id number]   PCP: Pcp Not In System DOB: 1929-08-21   Date of admission: 05/14/2015   Date of discharge: 05/17/2015    Discharge Diagnoses:  Principal Problem:   Acute pulmonary embolism ([place]) Active Problems:   HTN (hypertension)   Right leg DVT ([place])   Pancreatic cancer metastasized to lung [facility])   Palliative care encounter  Recommendations for Outpatient Follow-up:  1. Please continue care with hospice   Diet recommendation: regular diet  Activity: The patient is advised to gradually reintroduce usual activities.  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 The patient fell on Friday while trying to take a bath, landed on her right side, subsequently started noticing swelling of the right leg and this morning patient was noted to be short of breath by her daughter. Swelling of the right lower extremity did not improve by increasing the dose of Lasix. Patient recently got placed on home hospice. CT chest today showed small pulmonary embolism in the right lower lobe, without right heart strain. Numerous lung nodules which have progressed. Bilateral adrenal metastasis. Patient's last office oncology visit was on 04/20/15 from [facility]. Patient has been receiving intermittent blood transfusions at [facility] for hemoglobin less than 9.0. Most recent transfusion was on 1/23... Patient is mostly in the bed or in the recliner and sleeping most of the time. She is on methadone and oxycodone for pain control. Patient also has history of chronic back pain  and has received prior right L2-L3 steroid epidural steroid injections. According to the notes the patient has had a poor performance status ECOG of 3,, advanced disease, biliary obstruction, and does not have any treatment options available at this time. Good symptom management at home could increase her survival as opposed to chemotherapy. Patient was changed to being a DO NOT RESUSCITATE on 04/20/15. Patient is followed by Dr. Edwena Felty in clinic for pain management. She does not describe any cord compression symptoms."  Hospital Course:  Summary of her active problems in the hospital is as following. 1. Acute pulmonary embolism ([place]) The patient is presenting with complains of swelling of the right lower extremity. This has been progressed over last 4 days. Ultrasound is positive for DVT in water lower extremity. Patient has limited circulation. CT chest PE protocol is also positive for small PE without any RV strain. Echocardiogram does not show any acute abnormality. Continue with Lovenox per pharmacy As per my discussion with the patient as well as the family given her prognosis invasive therapeutic options including TPA or IVC are less likely beneficial. Pain medication were adjusted to control the pain.  2. Primary pancreatic cancer with metastasis to multiple sites  6 CM mass in the right lung Suspect her pulmonary mass for primary bronchogenic carcinoma. Patient's CT chest PE protocol is also positive for a new large mass 6 cm in size concerning for a primary bronchogenic carcinoma. The mass was not present on imaging done on July 16. Given the rapid progression metastasis is more likely. I discussed with the patient as well as family at length about possible outcome. Also discussed regarding options  with hospice as well as goals of care. Given the rapid progression i recommended that the patient should continue to remain with hospice to continue symptomatic management as well as comfort  measures. Family at present continues to request treating the blood clot with anticoagulation although is agreeing with no role of invasive measures. Patient continues to remain DNR/DNI. Most important for the patient is pain control as well as staying with the family. Prognosis is significantly guarded with few weeks. Foley catheter can be considered for comfort given significant pain with movement. Patient has refused to go for hospice house. I placed a phone call with [facility] oncologist and have not received a call back yet.  3. Urinary obstruction with nephrostomy tube. Patient has a nephrostomy tube as well while she is concerned about the output from the tube had recommended that we'll continue to monitor. CKs not significantly elevated.   All other chronic medical condition were stable during the hospitalization.  Patient was seen by physical therapy, hospice was arranged by Education officer, museum and case Freight forwarder. On the day of the discharge the patient's pain was well controlled, and no other acute medical condition were reported by patient. the patient was felt safe to be discharge at home with hospice.  Procedures and Results:  Lower right leg DVT  TTE: EF 55-60%, normal RV systolic function.  Consultations:  Palliative care  DISCHARGE MEDICATION: Current Discharge Medication List    START taking these medications   Details  cyclobenzaprine (FLEXERIL) 10 MG tablet Take 1 tablet (10 mg total) by mouth 3 (three) times daily. Qty: 30 tablet, Refills: 0    enoxaparin (LOVENOX) 80 MG/0.8ML injection Inject 0.7 mLs (70 mg total) into the skin daily. Qty: 30 Syringe, Refills: 0    lidocaine (XYLOCAINE) 2 % solution Use as directed 15 mLs in the mouth or throat 4 (four) times daily. Qty: 100 mL, Refills: 0    nystatin (MYCOSTATIN) 100000 UNIT/ML suspension Take 5 mLs (500,000 Units total) by mouth 4 (four) times daily. Qty: 60 mL, Refills: 0      CONTINUE these  medications which have CHANGED   Details  furosemide (LASIX) 20 MG tablet Take 1 tablet (20 mg total) by mouth daily as needed for fluid or edema. Qty: 30 tablet, Refills: 0    oxyCODONE (OXY IR/ROXICODONE) 5 MG immediate release tablet Take 1-2 tablets (5-10 mg total) by mouth every 4 (four) hours as needed for severe pain. Qty: 30 tablet, Refills: 0      CONTINUE these medications which have NOT CHANGED   Details  acetaminophen (TYLENOL) 500 MG tablet Take 500 mg by mouth every 6 (six) hours as needed for moderate pain.    calcium-vitamin D (OSCAL WITH D) 500-200 MG-UNIT per tablet Take 1 tablet by mouth 2 (two) times daily.    lidocaine (LIDODERM) 5 % PUT PATCH ON AFFECTED AREA FOR 12 HRS AND REMOVE FOR 12 HR Refills: 1    LORazepam (ATIVAN) 0.5 MG tablet Take 0.5 mg by mouth 2 (two) times daily.    methadone (DOLOPHINE) 1 mg/ml oral solution Take 3 mg/kg by mouth every 8 (eight) hours.    methadone (DOLOPHINE) 10 MG/5ML solution Take 3 mg by mouth every 8 (eight) hours.    ondansetron (ZOFRAN) 4 MG tablet Take 1 tablet (4 mg total) by mouth every 6 (six) hours as needed for nausea. Qty: 20 tablet, Refills: 0    Pancrelipase, Lip-Prot-Amyl, (ZENPEP PO) Take 2 capsules by mouth 3 (three)  times daily.    pantoprazole (PROTONIX) 20 MG tablet Take 20 mg by mouth daily.    polyethylene glycol (MIRALAX / GLYCOLAX) packet Take 17 g by mouth daily.    Probiotic Product (PROBIOTIC & ACIDOPHILUS EX ST) CAPS Take 1 capsule by mouth daily.    senna-docusate (SENNA PLUS) 8.6-50 MG tablet Take 3-4 tablets by mouth 2 (two) times daily. Takes three tablet in the morning and four tablets at dinner.    TURMERIC PO Take 1 tablet by mouth daily.    atenolol (TENORMIN) 25 MG tablet Take 1 tablet (25 mg total) by mouth 2 (two) times daily. Qty: 30 tablet, Refills: 0    docusate sodium 100 MG CAPS Take 100 mg by mouth 2 (two) times daily. Qty: 10 capsule, Refills: 0    simethicone (MYLICON)  80 MG chewable tablet Chew 1 tablet (80 mg total) by mouth every 6 (six) hours as needed for flatulence. Qty: 30 tablet, Refills: 0       Allergies  Allergen Reactions  . Oxaliplatin Itching  . Penicillins     Does not remember Has patient had a PCN reaction causing immediate rash, facial/tongue/throat swelling, SOB or lightheadedness with hypotension: unk Has patient had a PCN reaction causing severe rash involving mucus membranes or skin necrosis: unk Has patient had a PCN reaction that required hospitalization unk Has patient had a PCN reaction occurring within the last 10 years: unk If all of the above answers are "NO", then may proceed with Cep  . Tegaderm Ag Mesh [Silver]     Skin redness.   . Transderm-Scop [Scopolamine]     Had the opposite effect.    Discharge Instructions    Diet general    Complete by:  As directed      Increase activity slowly    Complete by:  As directed           Discharge Exam: Filed Weights   05/14/15 1506 05/14/15 1800  Weight: 47.129 kg (103 lb 14.4 oz) 47.129 kg (103 lb 14.4 oz)   Filed Vitals:   05/16/15 2224 05/17/15 0642  BP: 106/56 102/52  Pulse: 84 88  Temp:    Resp:     General: Appear in mild distress, no Rash; Oral Mucosa moist. Cardiovascular: S1 and S2 Present, no Murmur, no JVD Respiratory: Bilateral Air entry present and basal Crackles, no wheezes Abdomen: Bowel Sound present, Soft and no tenderness Extremities: right Pedal edema, right calf tenderness Neurology: Grossly no focal neuro deficit.  The results of significant diagnostics from this hospitalization (including imaging, microbiology, ancillary and laboratory) are listed below for reference.    Significant Diagnostic Studies: Ct Angio Chest Pe W/cm &/or Wo Cm  05/14/2015  CLINICAL DATA:  Pancreatic cancer. Right leg swelling. Rule out pulmonary embolism. EXAM: CT ANGIOGRAPHY CHEST WITH CONTRAST TECHNIQUE: Multidetector CT imaging of the chest was performed using  the standard protocol during bolus administration of intravenous contrast. Multiplanar CT image reconstructions and MIPs were obtained to evaluate the vascular anatomy. CONTRAST:  174mL OMNIPAQUE IOHEXOL 350 MG/ML SOLN COMPARISON:  CT chest 08/02/2013 FINDINGS: Small filling defect right lower lobe pulmonary artery compatible with small volume pulmonary embolus. No other pulmonary emboli identified. No evidence of right heart strain. Right ventricle nondilated. Pulmonary arteries normal in caliber. Negative for aortic dissection or aneurysm. Heart size upper normal. Mild pericardial thickening. No significant pericardial effusion. Numerous lung nodules bilaterally have progressed since the prior CT. These are irregular and ill-defined. The largest lesion  in the right lower lobe posteriorly measures 64 x 43 mm. Several lesions are angular. Other lesions are more nodular. Given the progression over time and history pancreatic cancer, these are likely related to metastatic disease. Right lower lobe lung cancer could also have this appearance. No pathologically enlarged hilar or mediastinal lymph nodes. Negative for pleural effusion Right adrenal mass measures 20 x 38 mm with significant growth from 10/20/2014 compatible with metastatic disease. Mild left adrenal enlargement also suggesting metastatic disease. Small amount of ascites. Review of the MIP images confirms the above findings. IMPRESSION: Small amount of pulmonary embolism right lower lobe pulmonary artery without right heart strain Numerous lung nodules have progressed from the prior study consistent with metastatic disease. Large mass right lower lobe could be related to primary bronchogenic carcinoma. Bilateral adrenal metastasis. Small amount of ascites in the upper abdomen. Electronically Signed   By: Franchot Gallo M.D.   On: 05/14/2015 14:13   Dg Chest Portable 1 View  05/14/2015  CLINICAL DATA:  Pain and swelling of the right leg.  Dyspnea. EXAM:  PORTABLE CHEST 1 VIEW COMPARISON:  CT chest 08/02/2013 FINDINGS: There is focal opacity in the right lower lobe. There is mild bilateral interstitial thickening likely chronic. There is no pleural effusion or pneumothorax. The heart and mediastinal contours are unremarkable. There is a right sided Port-A-Cath with the tip projecting over the SVC. The osseous structures are unremarkable. IMPRESSION: 1. Focal opacity in the right lower lobe concerning for pneumonia versus malignancy. Followup PA and lateral chest X-ray is recommended in 3-4 weeks following trial of antibiotic therapy to ensure resolution and exclude underlying malignancy. Electronically Signed   By: Kathreen Devoid   On: 05/14/2015 13:22   Dg Foot 2 Views Left  05/15/2015  CLINICAL DATA:  Fall, left foot pain. EXAM: LEFT FOOT - 2 VIEW COMPARISON:  None. FINDINGS: There is angulation of the left third toe laterally without visible fracture. The third MTP joint is mildly subluxed laterally, question chronic versus acute. Recommend clinical correlation for pain in this area and new versus old angulation deformity. No fracture visualized. Soft tissues are intact. IMPRESSION: Lateral angulation of the left third toe at the MTP joint, question subluxation. This could be chronic or acute. No fracture. Electronically Signed   By: Rolm Baptise M.D.   On: 05/15/2015 12:48   Dg Hip Unilat With Pelvis 2-3 Views Right  05/15/2015  CLINICAL DATA:  Fall, right hip pain. EXAM: DG HIP (WITH OR WITHOUT PELVIS) 2-3V RIGHT COMPARISON:  None. FINDINGS: No acute bony abnormality. Specifically, no fracture, subluxation, or dislocation. Soft tissues are intact. Early degenerative changes with early joint space narrowing and spurring in both hips. SI joints are symmetric and unremarkable. Contrast material noted within the urinary bladder. IMPRESSION: No acute bony abnormality. Electronically Signed   By: Rolm Baptise M.D.   On: 05/15/2015 12:46    Microbiology: No  results found for this or any previous visit (from the past 240 hour(s)).   Labs: CBC:  Recent Labs Lab 05/14/15 1307 05/14/15 1313 05/15/15 0527  WBC 6.9  --  7.9  NEUTROABS 5.3  --   --   HGB 10.9* 12.6 10.2*  HCT 34.0* 37.0 31.4*  MCV 84.8  --  84.6  PLT 231  --  [id number]   Basic Metabolic Panel:  Recent Labs Lab 05/14/15 1307 05/14/15 1313 05/14/15 1820 05/15/15 0527 05/16/15 0410  NA 134* 135  --  136 137  K 3.8 3.7  --  3.6 3.8  CL 101 102  --  106 107  CO2 23  --   --  22 24  GLUCOSE 99 96  --  111* 87  BUN 20 20  --  17 21*  CREATININE 0.91 0.90  --  0.90 0.82  CALCIUM 8.7*  --   --  8.2* 8.4*  MG  --   --  1.5*  --   --    Liver Function Tests:  Recent Labs Lab 05/14/15 1307 05/15/15 0527  AST 28 22  ALT 14 12*  ALKPHOS 73 62  BILITOT 0.6 0.4  PROT 6.9 6.2*  ALBUMIN 2.9* 2.5*   No results for input(s): LIPASE, AMYLASE in the last 168 hours. No results for input(s): AMMONIA in the last 168 hours. Cardiac Enzymes:  Recent Labs Lab 05/14/15 1820 05/14/15 2314 05/15/15 0528  CKTOTAL  --   --  74  TROPONINI 0.05* 0.05* 0.05*   BNP (last 3 results)  Recent Labs  05/14/15 1330  BNP 116.1*   CBG: No results for input(s): GLUCAP in the last 168 hours. Time spent: 30 minutes  Signed:  Berle Mull  [facility] 05/17/2015, 2:54 PM

## 2015-05-17 NOTE — Progress Notes (Signed)
Spoke with pt, son and daughter at pt's bedside concerning Home with [facility] and Private Duty sitters. Pt's daughter had a copy of [employer].

## 2015-05-17 NOTE — Progress Notes (Signed)
Inpatient [facility] [place] and [facility] [place] RN Visit.  GIP related, not covered admission to her [facility] DX of Pancreatic Cancer. Patient is a DNR. Patient seen in room, sitting up eating breakfast. She reports that her pain seems better controlled over the past 24 hours.  Her only complaint is the swelling of her RLE.  Patient is on RA with O2 sats at 98% and respirations WNL. She has eaten about 25% of her breakfast, which includes an omlette and english muffin. Patient has received 2 doses of 10mg  Oxycodone, 1 dose of 5 mg Oxycodone IR and 3 doses of 650 mg Tylenol po the past 24 hours.  Patient denies any DME needs prior to hospital discharge. [facility] will continue to follow and anticipate any discharge needs.  Please call with any questions.  Freddi Starr, RN BSN [facility] Liaison [phone]

## 2015-05-17 NOTE — Progress Notes (Signed)
Pt for discharge to home and CSW received notification from RN that pt needing ambulance transport home.   CSW confirmed address with pt daughter. CSW arranged ambulance transport scheduled for 4 pm pick up.  No further social work needs identified at this time.  CSW signing off.   Alison Murray, MSW, [place] Work [phone]

## 2015-05-17 NOTE — Progress Notes (Signed)
ANTICOAGULATION CONSULT NOTE - Follow Up Consult  Pharmacy Consult for lovenox  Indication: new PE/DVT  Patient Measurements: Height: 5\' 2"  (157.5 cm) Weight: 103 lb 14.4 oz (47.129 kg) IBW/kg (Calculated) : 50.1  Vital Signs: BP: 102/52 mmHg (02/08 0642) Pulse Rate: 88 (02/08 0642)  Labs:  Recent Labs  05/14/15 1307 05/14/15 1313 05/14/15 1820 05/14/15 2314 05/15/15 0527 05/15/15 0528 05/15/15 0859 05/15/15 1710 05/16/15 0410  HGB 10.9* 12.6  --   --  10.2*  --   --   --   --   HCT 34.0* 37.0  --   --  31.4*  --   --   --   --   PLT 231  --   --   --  228  --   --   --   --   APTT 37  --   --   --   --   --   --   --   --   LABPROT 15.8*  --   --   --  17.0*  --   --   --   --   INR 1.24  --   --   --  1.38  --   --   --   --   HEPARINUNFRC  --   --   --  0.19*  --   --  0.34 0.21*  --   CREATININE 0.91 0.90  --   --  0.90  --   --   --  0.82  CKTOTAL  --   --   --   --   --  74  --   --   --   TROPONINI  --   --  0.05* 0.05*  --  0.05*  --   --   --     Estimated Creatinine Clearance: 38.7 mL/min (by C-G formula based on Cr of 0.82).   Assessment: Patient's an 82 y.o F with hx metastatic pancreatic cancer presented to the ED on 2/5 with c/o right lower extremity pain and swelling.  Further workup revealed new PE and DVT.  Patient was initially started on heparin drip and then transitioned to full lovenox on 05/15/15.  Today, 05/17/2015: -  hgb down 10.2 on 2/6, plt ok - scr stable, crcl>30 - no bleeding documented  Goal of Therapy:  Anti-Xa level 0.6-1 units/ml 4hrs after LMWH dose given Monitor platelets by anticoagulation protocol: Yes   Plan:  - will change lovenox from 45 mg q12h  to 70 mg SQ q24h (~1.5 mg/kg/day) to facilitate ease of administration for patient at discharge - monitor for s/s bleeding  Wylodean Shimmel P 05/17/2015,10:06 AM

## 2015-05-17 NOTE — Progress Notes (Signed)
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  Pt in bed.  Required increased time and assist to get from sidelying to sitting EOB due to R thigh pain.  Noted leg nearly twice the size as her other.  Gently assisted R LE to floor.  Pt was able to sit EOB x 7 min at Supervision level.  Then c/o ABD pain with some nausea and coughing up sputum.  With + 2 assist attempted standing however pt was unable to tolerate any WBing thru her R LE crying in pain.  Unable to take any functional steps, assisted 1/4 turn to recliner.  Positioned to comfort with multiple pillows.  Instructed daughter on transfer tech and pt ability.  Will need to D/C to home via ambulance.    Follow Up Recommendations  Home health PT 24/7 family care     Equipment Recommendations  Wheelchair (measurements PT) (has a hospital bed, has a walker, has a 3:1 and given a new bed pan. )    Recommendations for Other Services       Precautions / Restrictions Precautions Precautions: Fall Precaution Comments: R LE DVT with edema Restrictions Weight Bearing Restrictions: No    Mobility  Bed Mobility Overal bed mobility: Needs Assistance;+ 2 for safety/equipment Bed Mobility: Supine to Sit           General bed mobility comments: HOB elevated and increased time with assist to move R LE due to increased edema and pain  Transfers Overall transfer level: Needs assistance Equipment used: Rolling walker (2 wheeled) Transfers: Sit to/from [employer] Sit to Stand: Mod assist;+2 physical assistance;+2 safety/equipment Stand pivot transfers: Max assist;+2 physical assistance;+2 safety/equipment       General transfer comment: attempted sit to stand twice.  Pt unable to tolerate WBing thru R LE.  Pt unable to take any functional steps.  Performed 1/4 pivot from bed to recliner with much effort.  Very limited tolerance due to R thigh pain and ABD.  Pt in tears.  Ambulation/Gait             General Gait Details: Unable to tolerate any WBing thru R LE and unable to take any functional steps due to pain level R thigh and ABD region.   Stairs            Wheelchair Mobility    Modified Rankin (Stroke Patients Only)       Balance                                    Cognition Arousal/Alertness: Awake/alert Behavior During Therapy: WFL for tasks assessed/performed Overall Cognitive Status: Within Functional Limits for tasks assessed                      Exercises      General Comments        Pertinent Vitals/Pain Faces Pain Scale: Hurts whole lot Pain Location: R thigh and ABD Pain Descriptors / Indicators:  Crying;Grimacing Pain Intervention(s): Premedicated before session;Repositioned    Home Living                      Prior Function            PT Goals (current goals can now be found in the care plan section) Progress towards PT goals: Progressing toward goals    Frequency  Min 3X/week    PT Plan Current plan remains appropriate    Co-evaluation             End of Session Equipment Utilized During Treatment: Gait belt Activity Tolerance: Patient limited by pain Patient left: in chair;with call bell/phone within reach;with family/visitor present     Time: 1415-1440 PT Time Calculation (min) (ACUTE ONLY): 25 min  Charges:  $Therapeutic Activity: 23-37 mins                    G Codes:      Vicki Chavez  PTA [facility]  Acute  Rehab Pager       [phone]

## 2015-07-08 DEATH — deceased

## 2016-10-22 IMAGING — DX DG FOOT 2V*L*
2 series · 2 of 2 positions shown · non-contrast
Comparison: None.

CLINICAL DATA: Fall, left foot pain.

EXAM:
LEFT FOOT - 2 VIEW

[foot ap]
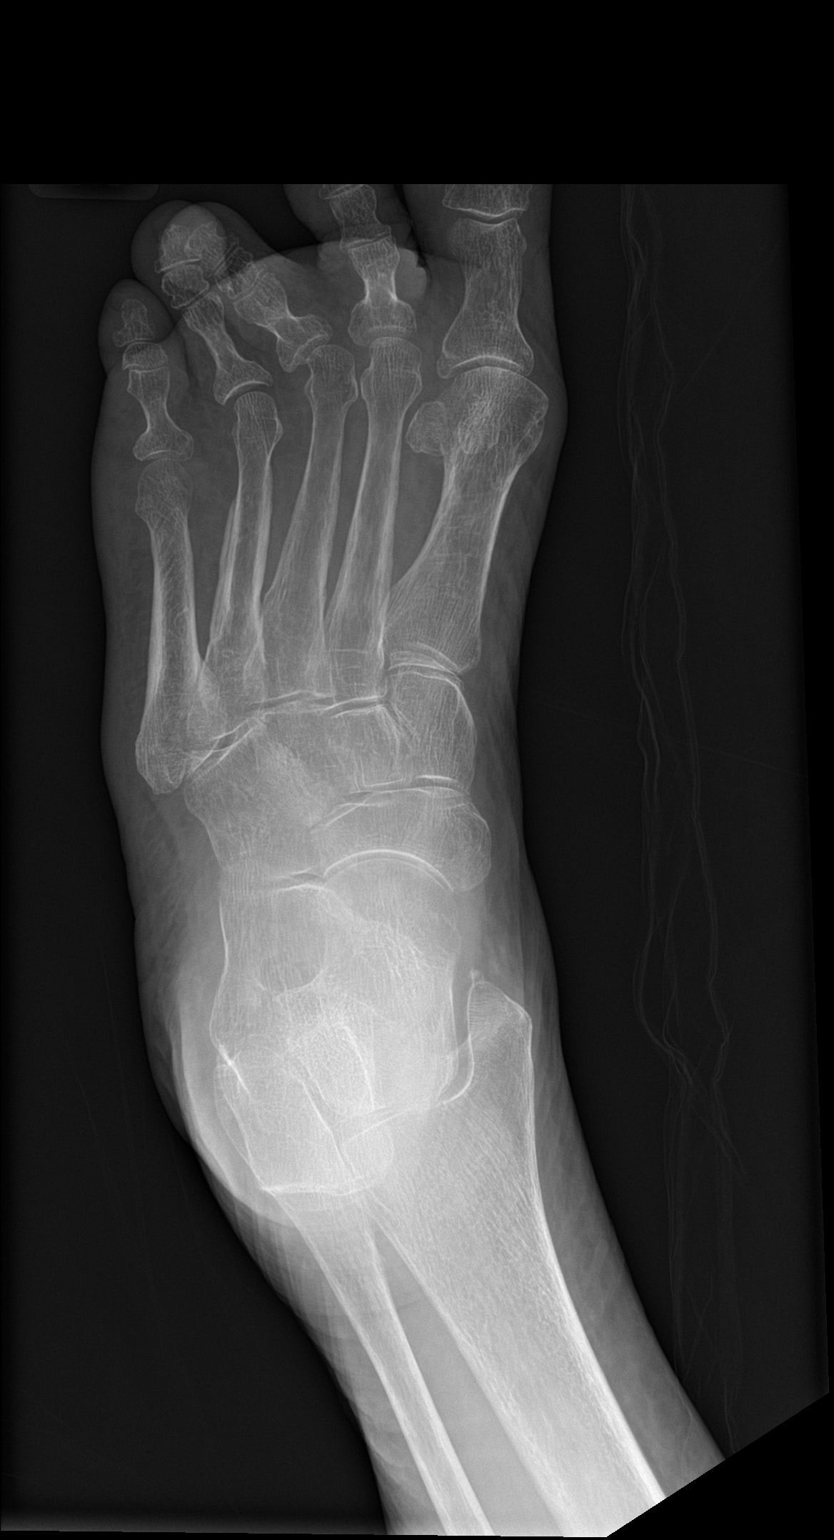

[foot lat]
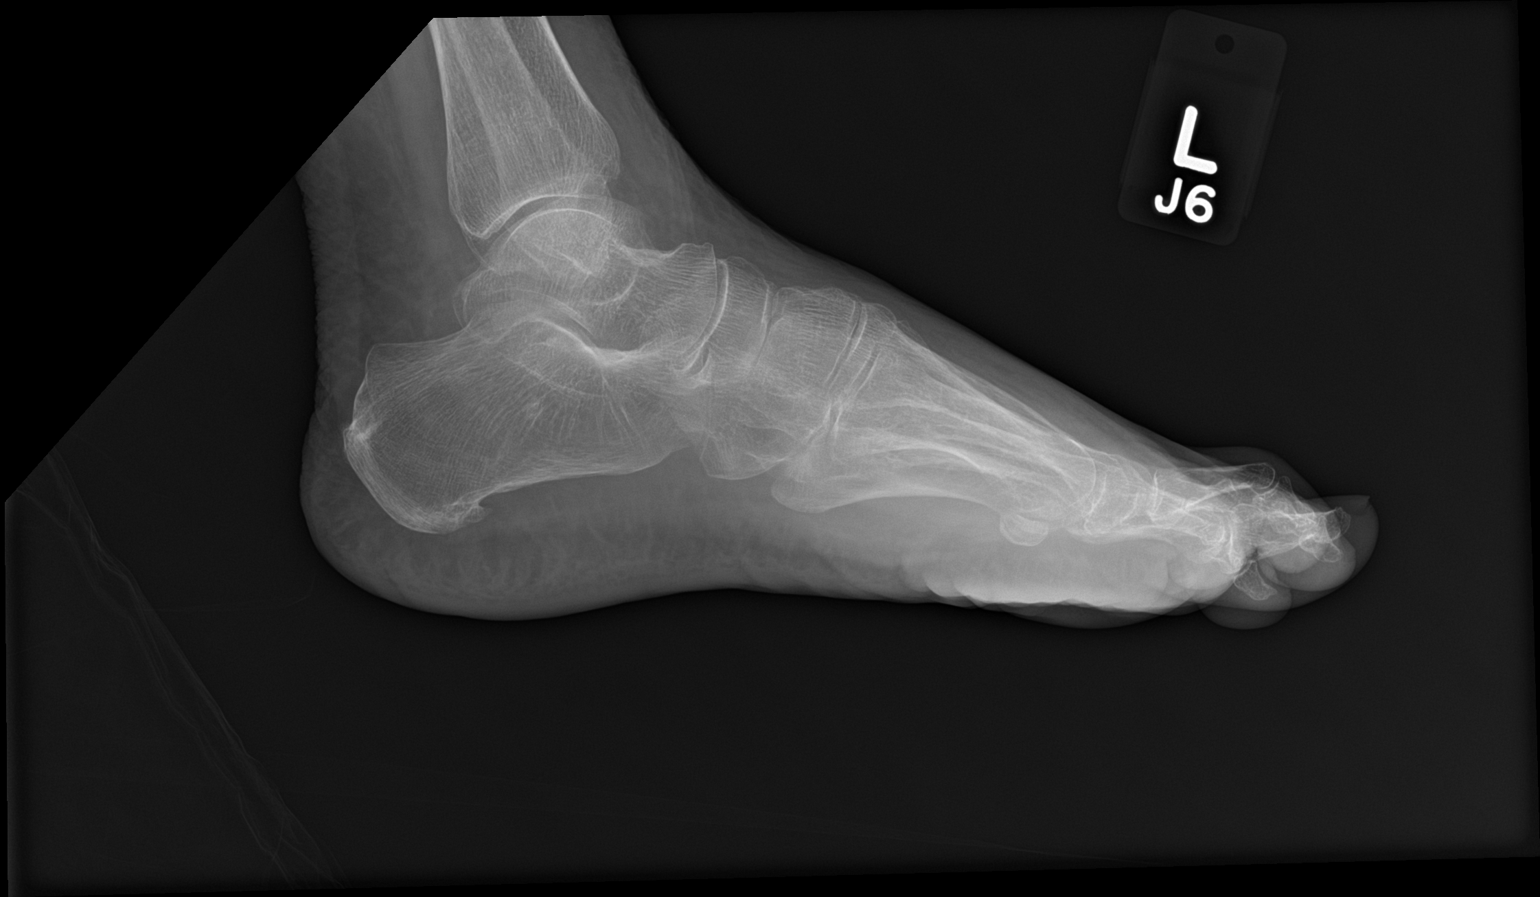

[2 of 2 positions shown; findings below may reference images not displayed]

FINDINGS: There is angulation of the left third toe laterally without visible
fracture. The third MTP joint is mildly subluxed laterally, question
chronic versus acute. Recommend clinical correlation for pain in
this area and new versus old angulation deformity. No fracture
visualized. Soft tissues are intact.
IMPRESSION: Lateral angulation of the left third toe at the MTP joint, question
subluxation. This could be chronic or acute. No fracture.
